# Patient Record
Sex: Female | Born: 1965
Health system: Southern US, Community
[De-identification: ages and names within clinical notes are randomized; demographics above are authoritative.]

## PROBLEM LIST (undated history)

## (undated) DIAGNOSIS — G43909 Migraine, unspecified, not intractable, without status migrainosus: Secondary | ICD-10-CM

## (undated) DIAGNOSIS — C4491 Basal cell carcinoma of skin, unspecified: Secondary | ICD-10-CM

## (undated) DIAGNOSIS — K219 Gastro-esophageal reflux disease without esophagitis: Secondary | ICD-10-CM

## (undated) DIAGNOSIS — J45909 Unspecified asthma, uncomplicated: Secondary | ICD-10-CM

## (undated) DIAGNOSIS — E7800 Pure hypercholesterolemia, unspecified: Secondary | ICD-10-CM

## (undated) DIAGNOSIS — E119 Type 2 diabetes mellitus without complications: Secondary | ICD-10-CM

## (undated) DIAGNOSIS — N6452 Nipple discharge: Secondary | ICD-10-CM

## (undated) DIAGNOSIS — Z8489 Family history of other specified conditions: Secondary | ICD-10-CM

## (undated) DIAGNOSIS — S299XXA Unspecified injury of thorax, initial encounter: Secondary | ICD-10-CM

## (undated) DIAGNOSIS — F419 Anxiety disorder, unspecified: Secondary | ICD-10-CM

---

## 2000-05-26 ENCOUNTER — Other Ambulatory Visit: Admission: RE | Admit: 2000-05-26 | Discharge: 2000-05-26 | Payer: Self-pay | Admitting: Obstetrics and Gynecology

## 2001-07-13 ENCOUNTER — Other Ambulatory Visit: Admission: RE | Admit: 2001-07-13 | Discharge: 2001-07-13 | Payer: Self-pay | Admitting: Obstetrics and Gynecology

## 2002-07-17 ENCOUNTER — Other Ambulatory Visit: Admission: RE | Admit: 2002-07-17 | Discharge: 2002-07-17 | Payer: Self-pay | Admitting: Obstetrics and Gynecology

## 2003-04-14 ENCOUNTER — Emergency Department (HOSPITAL_COMMUNITY): Admission: EM | Admit: 2003-04-14 | Discharge: 2003-04-14 | Payer: Self-pay | Admitting: Emergency Medicine

## 2003-08-08 ENCOUNTER — Encounter: Admission: RE | Admit: 2003-08-08 | Discharge: 2003-11-06 | Payer: Self-pay | Admitting: Obstetrics and Gynecology

## 2004-03-26 ENCOUNTER — Encounter: Admission: RE | Admit: 2004-03-26 | Discharge: 2004-05-01 | Payer: Self-pay | Admitting: Obstetrics and Gynecology

## 2004-05-29 ENCOUNTER — Encounter: Admission: RE | Admit: 2004-05-29 | Discharge: 2004-08-27 | Payer: Self-pay | Admitting: Obstetrics and Gynecology

## 2004-10-06 ENCOUNTER — Encounter: Admission: RE | Admit: 2004-10-06 | Discharge: 2005-01-04 | Payer: Self-pay | Admitting: Obstetrics and Gynecology

## 2005-01-12 ENCOUNTER — Encounter: Admission: RE | Admit: 2005-01-12 | Discharge: 2005-01-12 | Payer: Self-pay | Admitting: Family Medicine

## 2008-12-24 ENCOUNTER — Other Ambulatory Visit: Admission: RE | Admit: 2008-12-24 | Discharge: 2008-12-24 | Payer: Self-pay | Admitting: Family Medicine

## 2010-12-24 ENCOUNTER — Other Ambulatory Visit: Payer: Self-pay | Admitting: Family Medicine

## 2010-12-24 ENCOUNTER — Other Ambulatory Visit (HOSPITAL_COMMUNITY)
Admission: RE | Admit: 2010-12-24 | Discharge: 2010-12-24 | Disposition: A | Discharge status: Home / Self Care | Payer: BC Managed Care – PPO | Source: Ambulatory Visit | Attending: Family Medicine | Admitting: Family Medicine

## 2010-12-24 DIAGNOSIS — Z124 Encounter for screening for malignant neoplasm of cervix: Secondary | ICD-10-CM | POA: Insufficient documentation

## 2010-12-24 DIAGNOSIS — Z1159 Encounter for screening for other viral diseases: Secondary | ICD-10-CM | POA: Insufficient documentation

## 2014-01-19 ENCOUNTER — Other Ambulatory Visit (HOSPITAL_COMMUNITY)
Admission: RE | Admit: 2014-01-19 | Discharge: 2014-01-19 | Disposition: A | Discharge status: Home / Self Care | Payer: BC Managed Care – PPO | Source: Ambulatory Visit | Attending: Family Medicine | Admitting: Family Medicine

## 2014-01-19 ENCOUNTER — Other Ambulatory Visit: Payer: Self-pay | Admitting: Family Medicine

## 2014-01-19 DIAGNOSIS — Z124 Encounter for screening for malignant neoplasm of cervix: Secondary | ICD-10-CM | POA: Insufficient documentation

## 2014-01-22 LAB — CYTOLOGY - PAP

## 2015-08-29 DIAGNOSIS — F419 Anxiety disorder, unspecified: Secondary | ICD-10-CM | POA: Diagnosis not present

## 2015-08-29 DIAGNOSIS — E782 Mixed hyperlipidemia: Secondary | ICD-10-CM | POA: Diagnosis not present

## 2016-01-20 DIAGNOSIS — M9903 Segmental and somatic dysfunction of lumbar region: Secondary | ICD-10-CM | POA: Diagnosis not present

## 2016-01-20 DIAGNOSIS — M9901 Segmental and somatic dysfunction of cervical region: Secondary | ICD-10-CM | POA: Diagnosis not present

## 2016-01-20 DIAGNOSIS — M6080 Other myositis, unspecified site: Secondary | ICD-10-CM | POA: Diagnosis not present

## 2016-01-20 DIAGNOSIS — M542 Cervicalgia: Secondary | ICD-10-CM | POA: Diagnosis not present

## 2016-01-21 DIAGNOSIS — M9903 Segmental and somatic dysfunction of lumbar region: Secondary | ICD-10-CM | POA: Diagnosis not present

## 2016-01-21 DIAGNOSIS — M6080 Other myositis, unspecified site: Secondary | ICD-10-CM | POA: Diagnosis not present

## 2016-01-21 DIAGNOSIS — M542 Cervicalgia: Secondary | ICD-10-CM | POA: Diagnosis not present

## 2016-01-21 DIAGNOSIS — M9901 Segmental and somatic dysfunction of cervical region: Secondary | ICD-10-CM | POA: Diagnosis not present

## 2016-01-22 DIAGNOSIS — M542 Cervicalgia: Secondary | ICD-10-CM | POA: Diagnosis not present

## 2016-01-22 DIAGNOSIS — M9901 Segmental and somatic dysfunction of cervical region: Secondary | ICD-10-CM | POA: Diagnosis not present

## 2016-01-22 DIAGNOSIS — M6080 Other myositis, unspecified site: Secondary | ICD-10-CM | POA: Diagnosis not present

## 2016-01-22 DIAGNOSIS — M9903 Segmental and somatic dysfunction of lumbar region: Secondary | ICD-10-CM | POA: Diagnosis not present

## 2016-01-23 DIAGNOSIS — M6080 Other myositis, unspecified site: Secondary | ICD-10-CM | POA: Diagnosis not present

## 2016-01-23 DIAGNOSIS — M542 Cervicalgia: Secondary | ICD-10-CM | POA: Diagnosis not present

## 2016-01-23 DIAGNOSIS — M9901 Segmental and somatic dysfunction of cervical region: Secondary | ICD-10-CM | POA: Diagnosis not present

## 2016-01-23 DIAGNOSIS — M9903 Segmental and somatic dysfunction of lumbar region: Secondary | ICD-10-CM | POA: Diagnosis not present

## 2016-01-27 DIAGNOSIS — M542 Cervicalgia: Secondary | ICD-10-CM | POA: Diagnosis not present

## 2016-01-27 DIAGNOSIS — M9901 Segmental and somatic dysfunction of cervical region: Secondary | ICD-10-CM | POA: Diagnosis not present

## 2016-01-27 DIAGNOSIS — M6080 Other myositis, unspecified site: Secondary | ICD-10-CM | POA: Diagnosis not present

## 2016-01-27 DIAGNOSIS — M9903 Segmental and somatic dysfunction of lumbar region: Secondary | ICD-10-CM | POA: Diagnosis not present

## 2016-01-29 DIAGNOSIS — M6080 Other myositis, unspecified site: Secondary | ICD-10-CM | POA: Diagnosis not present

## 2016-01-29 DIAGNOSIS — M9901 Segmental and somatic dysfunction of cervical region: Secondary | ICD-10-CM | POA: Diagnosis not present

## 2016-01-29 DIAGNOSIS — M542 Cervicalgia: Secondary | ICD-10-CM | POA: Diagnosis not present

## 2016-01-29 DIAGNOSIS — M9903 Segmental and somatic dysfunction of lumbar region: Secondary | ICD-10-CM | POA: Diagnosis not present

## 2016-01-30 DIAGNOSIS — M9903 Segmental and somatic dysfunction of lumbar region: Secondary | ICD-10-CM | POA: Diagnosis not present

## 2016-01-30 DIAGNOSIS — M6080 Other myositis, unspecified site: Secondary | ICD-10-CM | POA: Diagnosis not present

## 2016-01-30 DIAGNOSIS — M9901 Segmental and somatic dysfunction of cervical region: Secondary | ICD-10-CM | POA: Diagnosis not present

## 2016-01-30 DIAGNOSIS — M542 Cervicalgia: Secondary | ICD-10-CM | POA: Diagnosis not present

## 2016-02-04 DIAGNOSIS — M9903 Segmental and somatic dysfunction of lumbar region: Secondary | ICD-10-CM | POA: Diagnosis not present

## 2016-02-04 DIAGNOSIS — M542 Cervicalgia: Secondary | ICD-10-CM | POA: Diagnosis not present

## 2016-02-04 DIAGNOSIS — M9901 Segmental and somatic dysfunction of cervical region: Secondary | ICD-10-CM | POA: Diagnosis not present

## 2016-02-04 DIAGNOSIS — M6080 Other myositis, unspecified site: Secondary | ICD-10-CM | POA: Diagnosis not present

## 2016-02-06 DIAGNOSIS — M6080 Other myositis, unspecified site: Secondary | ICD-10-CM | POA: Diagnosis not present

## 2016-02-06 DIAGNOSIS — M9901 Segmental and somatic dysfunction of cervical region: Secondary | ICD-10-CM | POA: Diagnosis not present

## 2016-02-06 DIAGNOSIS — M9903 Segmental and somatic dysfunction of lumbar region: Secondary | ICD-10-CM | POA: Diagnosis not present

## 2016-02-06 DIAGNOSIS — M542 Cervicalgia: Secondary | ICD-10-CM | POA: Diagnosis not present

## 2016-02-10 DIAGNOSIS — M6080 Other myositis, unspecified site: Secondary | ICD-10-CM | POA: Diagnosis not present

## 2016-02-10 DIAGNOSIS — M9903 Segmental and somatic dysfunction of lumbar region: Secondary | ICD-10-CM | POA: Diagnosis not present

## 2016-02-10 DIAGNOSIS — M542 Cervicalgia: Secondary | ICD-10-CM | POA: Diagnosis not present

## 2016-02-10 DIAGNOSIS — M9901 Segmental and somatic dysfunction of cervical region: Secondary | ICD-10-CM | POA: Diagnosis not present

## 2016-02-12 DIAGNOSIS — M9903 Segmental and somatic dysfunction of lumbar region: Secondary | ICD-10-CM | POA: Diagnosis not present

## 2016-02-12 DIAGNOSIS — M542 Cervicalgia: Secondary | ICD-10-CM | POA: Diagnosis not present

## 2016-02-12 DIAGNOSIS — M9901 Segmental and somatic dysfunction of cervical region: Secondary | ICD-10-CM | POA: Diagnosis not present

## 2016-02-12 DIAGNOSIS — M6080 Other myositis, unspecified site: Secondary | ICD-10-CM | POA: Diagnosis not present

## 2016-02-19 DIAGNOSIS — M542 Cervicalgia: Secondary | ICD-10-CM | POA: Diagnosis not present

## 2016-02-19 DIAGNOSIS — M6080 Other myositis, unspecified site: Secondary | ICD-10-CM | POA: Diagnosis not present

## 2016-02-19 DIAGNOSIS — M9901 Segmental and somatic dysfunction of cervical region: Secondary | ICD-10-CM | POA: Diagnosis not present

## 2016-02-19 DIAGNOSIS — M9903 Segmental and somatic dysfunction of lumbar region: Secondary | ICD-10-CM | POA: Diagnosis not present

## 2016-02-26 DIAGNOSIS — M9903 Segmental and somatic dysfunction of lumbar region: Secondary | ICD-10-CM | POA: Diagnosis not present

## 2016-02-26 DIAGNOSIS — M6080 Other myositis, unspecified site: Secondary | ICD-10-CM | POA: Diagnosis not present

## 2016-02-26 DIAGNOSIS — M542 Cervicalgia: Secondary | ICD-10-CM | POA: Diagnosis not present

## 2016-02-26 DIAGNOSIS — M9901 Segmental and somatic dysfunction of cervical region: Secondary | ICD-10-CM | POA: Diagnosis not present

## 2016-03-16 DIAGNOSIS — Z79899 Other long term (current) drug therapy: Secondary | ICD-10-CM | POA: Diagnosis not present

## 2016-03-16 DIAGNOSIS — Z0000 Encounter for general adult medical examination without abnormal findings: Secondary | ICD-10-CM | POA: Diagnosis not present

## 2016-03-16 DIAGNOSIS — E782 Mixed hyperlipidemia: Secondary | ICD-10-CM | POA: Diagnosis not present

## 2016-03-16 DIAGNOSIS — E1122 Type 2 diabetes mellitus with diabetic chronic kidney disease: Secondary | ICD-10-CM | POA: Diagnosis not present

## 2016-03-17 DIAGNOSIS — G43109 Migraine with aura, not intractable, without status migrainosus: Secondary | ICD-10-CM | POA: Diagnosis not present

## 2016-05-21 DIAGNOSIS — M9901 Segmental and somatic dysfunction of cervical region: Secondary | ICD-10-CM | POA: Diagnosis not present

## 2016-05-21 DIAGNOSIS — M542 Cervicalgia: Secondary | ICD-10-CM | POA: Diagnosis not present

## 2016-05-21 DIAGNOSIS — M9903 Segmental and somatic dysfunction of lumbar region: Secondary | ICD-10-CM | POA: Diagnosis not present

## 2016-05-21 DIAGNOSIS — M6080 Other myositis, unspecified site: Secondary | ICD-10-CM | POA: Diagnosis not present

## 2016-05-25 DIAGNOSIS — M542 Cervicalgia: Secondary | ICD-10-CM | POA: Diagnosis not present

## 2016-05-25 DIAGNOSIS — M9901 Segmental and somatic dysfunction of cervical region: Secondary | ICD-10-CM | POA: Diagnosis not present

## 2016-05-25 DIAGNOSIS — M6080 Other myositis, unspecified site: Secondary | ICD-10-CM | POA: Diagnosis not present

## 2016-05-25 DIAGNOSIS — M9903 Segmental and somatic dysfunction of lumbar region: Secondary | ICD-10-CM | POA: Diagnosis not present

## 2016-05-27 DIAGNOSIS — M542 Cervicalgia: Secondary | ICD-10-CM | POA: Diagnosis not present

## 2016-05-27 DIAGNOSIS — M6080 Other myositis, unspecified site: Secondary | ICD-10-CM | POA: Diagnosis not present

## 2016-05-27 DIAGNOSIS — M9903 Segmental and somatic dysfunction of lumbar region: Secondary | ICD-10-CM | POA: Diagnosis not present

## 2016-05-27 DIAGNOSIS — M9901 Segmental and somatic dysfunction of cervical region: Secondary | ICD-10-CM | POA: Diagnosis not present

## 2016-06-04 DIAGNOSIS — M6080 Other myositis, unspecified site: Secondary | ICD-10-CM | POA: Diagnosis not present

## 2016-06-04 DIAGNOSIS — M9903 Segmental and somatic dysfunction of lumbar region: Secondary | ICD-10-CM | POA: Diagnosis not present

## 2016-06-04 DIAGNOSIS — M542 Cervicalgia: Secondary | ICD-10-CM | POA: Diagnosis not present

## 2016-06-04 DIAGNOSIS — M9901 Segmental and somatic dysfunction of cervical region: Secondary | ICD-10-CM | POA: Diagnosis not present

## 2016-06-11 DIAGNOSIS — M9903 Segmental and somatic dysfunction of lumbar region: Secondary | ICD-10-CM | POA: Diagnosis not present

## 2016-06-11 DIAGNOSIS — M6080 Other myositis, unspecified site: Secondary | ICD-10-CM | POA: Diagnosis not present

## 2016-06-11 DIAGNOSIS — M9901 Segmental and somatic dysfunction of cervical region: Secondary | ICD-10-CM | POA: Diagnosis not present

## 2016-06-11 DIAGNOSIS — M542 Cervicalgia: Secondary | ICD-10-CM | POA: Diagnosis not present

## 2016-06-11 DIAGNOSIS — E2839 Other primary ovarian failure: Secondary | ICD-10-CM | POA: Diagnosis not present

## 2016-06-17 DIAGNOSIS — M9901 Segmental and somatic dysfunction of cervical region: Secondary | ICD-10-CM | POA: Diagnosis not present

## 2016-06-17 DIAGNOSIS — M6080 Other myositis, unspecified site: Secondary | ICD-10-CM | POA: Diagnosis not present

## 2016-06-17 DIAGNOSIS — M9903 Segmental and somatic dysfunction of lumbar region: Secondary | ICD-10-CM | POA: Diagnosis not present

## 2016-06-17 DIAGNOSIS — M542 Cervicalgia: Secondary | ICD-10-CM | POA: Diagnosis not present

## 2016-06-18 DIAGNOSIS — M9903 Segmental and somatic dysfunction of lumbar region: Secondary | ICD-10-CM | POA: Diagnosis not present

## 2016-06-18 DIAGNOSIS — M6080 Other myositis, unspecified site: Secondary | ICD-10-CM | POA: Diagnosis not present

## 2016-06-18 DIAGNOSIS — M542 Cervicalgia: Secondary | ICD-10-CM | POA: Diagnosis not present

## 2016-06-18 DIAGNOSIS — M9901 Segmental and somatic dysfunction of cervical region: Secondary | ICD-10-CM | POA: Diagnosis not present

## 2016-06-23 DIAGNOSIS — M9903 Segmental and somatic dysfunction of lumbar region: Secondary | ICD-10-CM | POA: Diagnosis not present

## 2016-06-23 DIAGNOSIS — M6080 Other myositis, unspecified site: Secondary | ICD-10-CM | POA: Diagnosis not present

## 2016-06-23 DIAGNOSIS — M9901 Segmental and somatic dysfunction of cervical region: Secondary | ICD-10-CM | POA: Diagnosis not present

## 2016-06-23 DIAGNOSIS — M542 Cervicalgia: Secondary | ICD-10-CM | POA: Diagnosis not present

## 2016-06-24 DIAGNOSIS — M542 Cervicalgia: Secondary | ICD-10-CM | POA: Diagnosis not present

## 2016-06-24 DIAGNOSIS — M9901 Segmental and somatic dysfunction of cervical region: Secondary | ICD-10-CM | POA: Diagnosis not present

## 2016-06-24 DIAGNOSIS — M9903 Segmental and somatic dysfunction of lumbar region: Secondary | ICD-10-CM | POA: Diagnosis not present

## 2016-06-24 DIAGNOSIS — M6080 Other myositis, unspecified site: Secondary | ICD-10-CM | POA: Diagnosis not present

## 2016-06-25 DIAGNOSIS — M542 Cervicalgia: Secondary | ICD-10-CM | POA: Diagnosis not present

## 2016-06-25 DIAGNOSIS — M6080 Other myositis, unspecified site: Secondary | ICD-10-CM | POA: Diagnosis not present

## 2016-06-25 DIAGNOSIS — S299XXA Unspecified injury of thorax, initial encounter: Secondary | ICD-10-CM

## 2016-06-25 DIAGNOSIS — N63 Unspecified lump in unspecified breast: Secondary | ICD-10-CM | POA: Diagnosis not present

## 2016-06-25 DIAGNOSIS — M9903 Segmental and somatic dysfunction of lumbar region: Secondary | ICD-10-CM | POA: Diagnosis not present

## 2016-06-25 DIAGNOSIS — M9901 Segmental and somatic dysfunction of cervical region: Secondary | ICD-10-CM | POA: Diagnosis not present

## 2016-06-25 DIAGNOSIS — Z1211 Encounter for screening for malignant neoplasm of colon: Secondary | ICD-10-CM | POA: Diagnosis not present

## 2016-06-25 HISTORY — DX: Unspecified injury of thorax, initial encounter: S29.9XXA

## 2016-06-26 ENCOUNTER — Other Ambulatory Visit: Payer: Self-pay | Admitting: Family Medicine

## 2016-06-29 ENCOUNTER — Other Ambulatory Visit: Payer: Self-pay | Admitting: Family Medicine

## 2016-06-29 DIAGNOSIS — M9903 Segmental and somatic dysfunction of lumbar region: Secondary | ICD-10-CM | POA: Diagnosis not present

## 2016-06-29 DIAGNOSIS — M542 Cervicalgia: Secondary | ICD-10-CM | POA: Diagnosis not present

## 2016-06-29 DIAGNOSIS — M6080 Other myositis, unspecified site: Secondary | ICD-10-CM | POA: Diagnosis not present

## 2016-06-29 DIAGNOSIS — N63 Unspecified lump in unspecified breast: Secondary | ICD-10-CM

## 2016-06-29 DIAGNOSIS — M9901 Segmental and somatic dysfunction of cervical region: Secondary | ICD-10-CM | POA: Diagnosis not present

## 2016-06-30 ENCOUNTER — Other Ambulatory Visit: Payer: Self-pay | Admitting: Family Medicine

## 2016-06-30 DIAGNOSIS — N63 Unspecified lump in unspecified breast: Secondary | ICD-10-CM

## 2016-07-02 DIAGNOSIS — M9901 Segmental and somatic dysfunction of cervical region: Secondary | ICD-10-CM | POA: Diagnosis not present

## 2016-07-02 DIAGNOSIS — M6080 Other myositis, unspecified site: Secondary | ICD-10-CM | POA: Diagnosis not present

## 2016-07-02 DIAGNOSIS — M542 Cervicalgia: Secondary | ICD-10-CM | POA: Diagnosis not present

## 2016-07-02 DIAGNOSIS — M9903 Segmental and somatic dysfunction of lumbar region: Secondary | ICD-10-CM | POA: Diagnosis not present

## 2016-07-09 DIAGNOSIS — M9901 Segmental and somatic dysfunction of cervical region: Secondary | ICD-10-CM | POA: Diagnosis not present

## 2016-07-09 DIAGNOSIS — M542 Cervicalgia: Secondary | ICD-10-CM | POA: Diagnosis not present

## 2016-07-09 DIAGNOSIS — M6080 Other myositis, unspecified site: Secondary | ICD-10-CM | POA: Diagnosis not present

## 2016-07-09 DIAGNOSIS — M9903 Segmental and somatic dysfunction of lumbar region: Secondary | ICD-10-CM | POA: Diagnosis not present

## 2016-07-15 DIAGNOSIS — M542 Cervicalgia: Secondary | ICD-10-CM | POA: Diagnosis not present

## 2016-07-15 DIAGNOSIS — M9901 Segmental and somatic dysfunction of cervical region: Secondary | ICD-10-CM | POA: Diagnosis not present

## 2016-07-15 DIAGNOSIS — M9903 Segmental and somatic dysfunction of lumbar region: Secondary | ICD-10-CM | POA: Diagnosis not present

## 2016-07-15 DIAGNOSIS — M6080 Other myositis, unspecified site: Secondary | ICD-10-CM | POA: Diagnosis not present

## 2016-07-22 DIAGNOSIS — M9903 Segmental and somatic dysfunction of lumbar region: Secondary | ICD-10-CM | POA: Diagnosis not present

## 2016-07-22 DIAGNOSIS — M6080 Other myositis, unspecified site: Secondary | ICD-10-CM | POA: Diagnosis not present

## 2016-07-22 DIAGNOSIS — M9901 Segmental and somatic dysfunction of cervical region: Secondary | ICD-10-CM | POA: Diagnosis not present

## 2016-07-22 DIAGNOSIS — M542 Cervicalgia: Secondary | ICD-10-CM | POA: Diagnosis not present

## 2016-07-27 ENCOUNTER — Ambulatory Visit
Admission: RE | Admit: 2016-07-27 | Discharge: 2016-07-27 | Disposition: A | Discharge status: Home / Self Care | Payer: BLUE CROSS/BLUE SHIELD | Source: Ambulatory Visit | Attending: Family Medicine | Admitting: Family Medicine

## 2016-07-27 DIAGNOSIS — N63 Unspecified lump in unspecified breast: Secondary | ICD-10-CM

## 2016-07-27 DIAGNOSIS — R928 Other abnormal and inconclusive findings on diagnostic imaging of breast: Secondary | ICD-10-CM | POA: Diagnosis not present

## 2016-07-27 HISTORY — DX: Unspecified injury of thorax, initial encounter: S29.9XXA

## 2016-07-29 DIAGNOSIS — M542 Cervicalgia: Secondary | ICD-10-CM | POA: Diagnosis not present

## 2016-07-29 DIAGNOSIS — M9903 Segmental and somatic dysfunction of lumbar region: Secondary | ICD-10-CM | POA: Diagnosis not present

## 2016-07-29 DIAGNOSIS — M6080 Other myositis, unspecified site: Secondary | ICD-10-CM | POA: Diagnosis not present

## 2016-07-29 DIAGNOSIS — M9901 Segmental and somatic dysfunction of cervical region: Secondary | ICD-10-CM | POA: Diagnosis not present

## 2016-08-03 DIAGNOSIS — M6080 Other myositis, unspecified site: Secondary | ICD-10-CM | POA: Diagnosis not present

## 2016-08-03 DIAGNOSIS — M542 Cervicalgia: Secondary | ICD-10-CM | POA: Diagnosis not present

## 2016-08-03 DIAGNOSIS — M9901 Segmental and somatic dysfunction of cervical region: Secondary | ICD-10-CM | POA: Diagnosis not present

## 2016-08-03 DIAGNOSIS — M9903 Segmental and somatic dysfunction of lumbar region: Secondary | ICD-10-CM | POA: Diagnosis not present

## 2016-08-05 DIAGNOSIS — M542 Cervicalgia: Secondary | ICD-10-CM | POA: Diagnosis not present

## 2016-08-05 DIAGNOSIS — M6080 Other myositis, unspecified site: Secondary | ICD-10-CM | POA: Diagnosis not present

## 2016-08-05 DIAGNOSIS — M9901 Segmental and somatic dysfunction of cervical region: Secondary | ICD-10-CM | POA: Diagnosis not present

## 2016-08-05 DIAGNOSIS — M9903 Segmental and somatic dysfunction of lumbar region: Secondary | ICD-10-CM | POA: Diagnosis not present

## 2016-08-10 DIAGNOSIS — M9903 Segmental and somatic dysfunction of lumbar region: Secondary | ICD-10-CM | POA: Diagnosis not present

## 2016-08-10 DIAGNOSIS — M6080 Other myositis, unspecified site: Secondary | ICD-10-CM | POA: Diagnosis not present

## 2016-08-10 DIAGNOSIS — M9901 Segmental and somatic dysfunction of cervical region: Secondary | ICD-10-CM | POA: Diagnosis not present

## 2016-08-10 DIAGNOSIS — M542 Cervicalgia: Secondary | ICD-10-CM | POA: Diagnosis not present

## 2016-08-12 DIAGNOSIS — M542 Cervicalgia: Secondary | ICD-10-CM | POA: Diagnosis not present

## 2016-08-12 DIAGNOSIS — M6080 Other myositis, unspecified site: Secondary | ICD-10-CM | POA: Diagnosis not present

## 2016-08-12 DIAGNOSIS — M9901 Segmental and somatic dysfunction of cervical region: Secondary | ICD-10-CM | POA: Diagnosis not present

## 2016-08-12 DIAGNOSIS — M9903 Segmental and somatic dysfunction of lumbar region: Secondary | ICD-10-CM | POA: Diagnosis not present

## 2016-08-17 DIAGNOSIS — M9903 Segmental and somatic dysfunction of lumbar region: Secondary | ICD-10-CM | POA: Diagnosis not present

## 2016-08-17 DIAGNOSIS — M542 Cervicalgia: Secondary | ICD-10-CM | POA: Diagnosis not present

## 2016-08-17 DIAGNOSIS — M9901 Segmental and somatic dysfunction of cervical region: Secondary | ICD-10-CM | POA: Diagnosis not present

## 2016-08-17 DIAGNOSIS — M6080 Other myositis, unspecified site: Secondary | ICD-10-CM | POA: Diagnosis not present

## 2016-08-20 DIAGNOSIS — M9901 Segmental and somatic dysfunction of cervical region: Secondary | ICD-10-CM | POA: Diagnosis not present

## 2016-08-20 DIAGNOSIS — M9903 Segmental and somatic dysfunction of lumbar region: Secondary | ICD-10-CM | POA: Diagnosis not present

## 2016-08-20 DIAGNOSIS — M6080 Other myositis, unspecified site: Secondary | ICD-10-CM | POA: Diagnosis not present

## 2016-08-20 DIAGNOSIS — M542 Cervicalgia: Secondary | ICD-10-CM | POA: Diagnosis not present

## 2016-08-24 DIAGNOSIS — M6080 Other myositis, unspecified site: Secondary | ICD-10-CM | POA: Diagnosis not present

## 2016-08-24 DIAGNOSIS — M542 Cervicalgia: Secondary | ICD-10-CM | POA: Diagnosis not present

## 2016-08-24 DIAGNOSIS — M9901 Segmental and somatic dysfunction of cervical region: Secondary | ICD-10-CM | POA: Diagnosis not present

## 2016-08-24 DIAGNOSIS — M9903 Segmental and somatic dysfunction of lumbar region: Secondary | ICD-10-CM | POA: Diagnosis not present

## 2016-08-26 DIAGNOSIS — M9903 Segmental and somatic dysfunction of lumbar region: Secondary | ICD-10-CM | POA: Diagnosis not present

## 2016-08-26 DIAGNOSIS — M6080 Other myositis, unspecified site: Secondary | ICD-10-CM | POA: Diagnosis not present

## 2016-08-26 DIAGNOSIS — M9901 Segmental and somatic dysfunction of cervical region: Secondary | ICD-10-CM | POA: Diagnosis not present

## 2016-08-26 DIAGNOSIS — M542 Cervicalgia: Secondary | ICD-10-CM | POA: Diagnosis not present

## 2016-09-02 DIAGNOSIS — M9903 Segmental and somatic dysfunction of lumbar region: Secondary | ICD-10-CM | POA: Diagnosis not present

## 2016-09-02 DIAGNOSIS — M542 Cervicalgia: Secondary | ICD-10-CM | POA: Diagnosis not present

## 2016-09-02 DIAGNOSIS — M9901 Segmental and somatic dysfunction of cervical region: Secondary | ICD-10-CM | POA: Diagnosis not present

## 2016-09-02 DIAGNOSIS — M6080 Other myositis, unspecified site: Secondary | ICD-10-CM | POA: Diagnosis not present

## 2016-09-09 DIAGNOSIS — M7502 Adhesive capsulitis of left shoulder: Secondary | ICD-10-CM | POA: Diagnosis not present

## 2016-09-10 DIAGNOSIS — M9903 Segmental and somatic dysfunction of lumbar region: Secondary | ICD-10-CM | POA: Diagnosis not present

## 2016-09-10 DIAGNOSIS — M542 Cervicalgia: Secondary | ICD-10-CM | POA: Diagnosis not present

## 2016-09-10 DIAGNOSIS — M6080 Other myositis, unspecified site: Secondary | ICD-10-CM | POA: Diagnosis not present

## 2016-09-10 DIAGNOSIS — M9901 Segmental and somatic dysfunction of cervical region: Secondary | ICD-10-CM | POA: Diagnosis not present

## 2016-09-16 DIAGNOSIS — M9901 Segmental and somatic dysfunction of cervical region: Secondary | ICD-10-CM | POA: Diagnosis not present

## 2016-09-16 DIAGNOSIS — M6080 Other myositis, unspecified site: Secondary | ICD-10-CM | POA: Diagnosis not present

## 2016-09-16 DIAGNOSIS — M542 Cervicalgia: Secondary | ICD-10-CM | POA: Diagnosis not present

## 2016-09-16 DIAGNOSIS — M9903 Segmental and somatic dysfunction of lumbar region: Secondary | ICD-10-CM | POA: Diagnosis not present

## 2016-09-24 DIAGNOSIS — M9903 Segmental and somatic dysfunction of lumbar region: Secondary | ICD-10-CM | POA: Diagnosis not present

## 2016-09-24 DIAGNOSIS — M6080 Other myositis, unspecified site: Secondary | ICD-10-CM | POA: Diagnosis not present

## 2016-09-24 DIAGNOSIS — M9901 Segmental and somatic dysfunction of cervical region: Secondary | ICD-10-CM | POA: Diagnosis not present

## 2016-09-24 DIAGNOSIS — M542 Cervicalgia: Secondary | ICD-10-CM | POA: Diagnosis not present

## 2016-09-30 DIAGNOSIS — M7502 Adhesive capsulitis of left shoulder: Secondary | ICD-10-CM | POA: Diagnosis not present

## 2016-10-07 DIAGNOSIS — M25512 Pain in left shoulder: Secondary | ICD-10-CM | POA: Diagnosis not present

## 2016-10-08 DIAGNOSIS — M9903 Segmental and somatic dysfunction of lumbar region: Secondary | ICD-10-CM | POA: Diagnosis not present

## 2016-10-08 DIAGNOSIS — M6080 Other myositis, unspecified site: Secondary | ICD-10-CM | POA: Diagnosis not present

## 2016-10-08 DIAGNOSIS — M542 Cervicalgia: Secondary | ICD-10-CM | POA: Diagnosis not present

## 2016-10-08 DIAGNOSIS — M9901 Segmental and somatic dysfunction of cervical region: Secondary | ICD-10-CM | POA: Diagnosis not present

## 2016-10-20 DIAGNOSIS — M25512 Pain in left shoulder: Secondary | ICD-10-CM | POA: Diagnosis not present

## 2016-10-27 DIAGNOSIS — M25612 Stiffness of left shoulder, not elsewhere classified: Secondary | ICD-10-CM | POA: Diagnosis not present

## 2016-10-27 DIAGNOSIS — M7502 Adhesive capsulitis of left shoulder: Secondary | ICD-10-CM | POA: Diagnosis not present

## 2016-10-27 DIAGNOSIS — M25512 Pain in left shoulder: Secondary | ICD-10-CM | POA: Diagnosis not present

## 2016-11-04 DIAGNOSIS — M7502 Adhesive capsulitis of left shoulder: Secondary | ICD-10-CM | POA: Diagnosis not present

## 2016-11-04 DIAGNOSIS — M25612 Stiffness of left shoulder, not elsewhere classified: Secondary | ICD-10-CM | POA: Diagnosis not present

## 2016-11-04 DIAGNOSIS — M25512 Pain in left shoulder: Secondary | ICD-10-CM | POA: Diagnosis not present

## 2016-11-06 DIAGNOSIS — F419 Anxiety disorder, unspecified: Secondary | ICD-10-CM | POA: Diagnosis not present

## 2016-11-06 DIAGNOSIS — E1122 Type 2 diabetes mellitus with diabetic chronic kidney disease: Secondary | ICD-10-CM | POA: Diagnosis not present

## 2016-11-06 DIAGNOSIS — L732 Hidradenitis suppurativa: Secondary | ICD-10-CM | POA: Diagnosis not present

## 2016-11-06 DIAGNOSIS — E782 Mixed hyperlipidemia: Secondary | ICD-10-CM | POA: Diagnosis not present

## 2016-11-20 DIAGNOSIS — L0292 Furuncle, unspecified: Secondary | ICD-10-CM | POA: Diagnosis not present

## 2017-02-18 DIAGNOSIS — L089 Local infection of the skin and subcutaneous tissue, unspecified: Secondary | ICD-10-CM | POA: Diagnosis not present

## 2017-03-26 ENCOUNTER — Other Ambulatory Visit: Payer: Self-pay | Admitting: Family Medicine

## 2017-03-26 ENCOUNTER — Other Ambulatory Visit (HOSPITAL_COMMUNITY)
Admission: RE | Admit: 2017-03-26 | Discharge: 2017-03-26 | Disposition: A | Discharge status: Home / Self Care | Payer: BLUE CROSS/BLUE SHIELD | Source: Ambulatory Visit | Attending: Family Medicine | Admitting: Family Medicine

## 2017-03-26 DIAGNOSIS — E559 Vitamin D deficiency, unspecified: Secondary | ICD-10-CM | POA: Diagnosis not present

## 2017-03-26 DIAGNOSIS — Z0000 Encounter for general adult medical examination without abnormal findings: Secondary | ICD-10-CM | POA: Diagnosis not present

## 2017-03-26 DIAGNOSIS — Z79899 Other long term (current) drug therapy: Secondary | ICD-10-CM | POA: Diagnosis not present

## 2017-03-26 DIAGNOSIS — Z124 Encounter for screening for malignant neoplasm of cervix: Secondary | ICD-10-CM | POA: Diagnosis not present

## 2017-03-26 DIAGNOSIS — Z01411 Encounter for gynecological examination (general) (routine) with abnormal findings: Secondary | ICD-10-CM | POA: Diagnosis not present

## 2017-03-26 DIAGNOSIS — E1122 Type 2 diabetes mellitus with diabetic chronic kidney disease: Secondary | ICD-10-CM | POA: Diagnosis not present

## 2017-03-26 DIAGNOSIS — E782 Mixed hyperlipidemia: Secondary | ICD-10-CM | POA: Diagnosis not present

## 2017-03-30 LAB — CYTOLOGY - PAP: HPV: DETECTED — AB

## 2017-04-28 ENCOUNTER — Other Ambulatory Visit: Payer: Self-pay | Admitting: Obstetrics and Gynecology

## 2017-04-28 DIAGNOSIS — N63 Unspecified lump in unspecified breast: Secondary | ICD-10-CM | POA: Diagnosis not present

## 2017-04-28 DIAGNOSIS — D260 Other benign neoplasm of cervix uteri: Secondary | ICD-10-CM | POA: Diagnosis not present

## 2017-04-28 DIAGNOSIS — N72 Inflammatory disease of cervix uteri: Secondary | ICD-10-CM | POA: Diagnosis not present

## 2017-04-28 DIAGNOSIS — R8789 Other abnormal findings in specimens from female genital organs: Secondary | ICD-10-CM | POA: Diagnosis not present

## 2017-04-29 ENCOUNTER — Other Ambulatory Visit: Payer: Self-pay | Admitting: Obstetrics and Gynecology

## 2017-04-29 DIAGNOSIS — N6320 Unspecified lump in the left breast, unspecified quadrant: Secondary | ICD-10-CM

## 2017-05-04 ENCOUNTER — Other Ambulatory Visit: Payer: Self-pay

## 2017-05-04 ENCOUNTER — Other Ambulatory Visit: Payer: Self-pay | Admitting: Obstetrics and Gynecology

## 2017-05-04 DIAGNOSIS — N6320 Unspecified lump in the left breast, unspecified quadrant: Secondary | ICD-10-CM

## 2017-05-05 ENCOUNTER — Ambulatory Visit
Admission: RE | Admit: 2017-05-05 | Discharge: 2017-05-05 | Disposition: A | Discharge status: Home / Self Care | Payer: BLUE CROSS/BLUE SHIELD | Source: Ambulatory Visit | Attending: Obstetrics and Gynecology | Admitting: Obstetrics and Gynecology

## 2017-05-05 ENCOUNTER — Other Ambulatory Visit: Payer: Self-pay | Admitting: Obstetrics and Gynecology

## 2017-05-05 DIAGNOSIS — N6320 Unspecified lump in the left breast, unspecified quadrant: Secondary | ICD-10-CM

## 2017-05-05 DIAGNOSIS — N611 Abscess of the breast and nipple: Secondary | ICD-10-CM

## 2017-05-05 DIAGNOSIS — R928 Other abnormal and inconclusive findings on diagnostic imaging of breast: Secondary | ICD-10-CM | POA: Diagnosis not present

## 2017-05-05 HISTORY — DX: Nipple discharge: N64.52

## 2017-05-31 DIAGNOSIS — M9903 Segmental and somatic dysfunction of lumbar region: Secondary | ICD-10-CM | POA: Diagnosis not present

## 2017-05-31 DIAGNOSIS — M542 Cervicalgia: Secondary | ICD-10-CM | POA: Diagnosis not present

## 2017-05-31 DIAGNOSIS — M9901 Segmental and somatic dysfunction of cervical region: Secondary | ICD-10-CM | POA: Diagnosis not present

## 2017-05-31 DIAGNOSIS — M6080 Other myositis, unspecified site: Secondary | ICD-10-CM | POA: Diagnosis not present

## 2017-06-02 DIAGNOSIS — M6080 Other myositis, unspecified site: Secondary | ICD-10-CM | POA: Diagnosis not present

## 2017-06-02 DIAGNOSIS — M9903 Segmental and somatic dysfunction of lumbar region: Secondary | ICD-10-CM | POA: Diagnosis not present

## 2017-06-02 DIAGNOSIS — M542 Cervicalgia: Secondary | ICD-10-CM | POA: Diagnosis not present

## 2017-06-02 DIAGNOSIS — M9901 Segmental and somatic dysfunction of cervical region: Secondary | ICD-10-CM | POA: Diagnosis not present

## 2017-06-03 DIAGNOSIS — M6080 Other myositis, unspecified site: Secondary | ICD-10-CM | POA: Diagnosis not present

## 2017-06-03 DIAGNOSIS — M542 Cervicalgia: Secondary | ICD-10-CM | POA: Diagnosis not present

## 2017-06-03 DIAGNOSIS — M9901 Segmental and somatic dysfunction of cervical region: Secondary | ICD-10-CM | POA: Diagnosis not present

## 2017-06-03 DIAGNOSIS — M9903 Segmental and somatic dysfunction of lumbar region: Secondary | ICD-10-CM | POA: Diagnosis not present

## 2017-07-01 DIAGNOSIS — M9903 Segmental and somatic dysfunction of lumbar region: Secondary | ICD-10-CM | POA: Diagnosis not present

## 2017-07-01 DIAGNOSIS — M9901 Segmental and somatic dysfunction of cervical region: Secondary | ICD-10-CM | POA: Diagnosis not present

## 2017-07-01 DIAGNOSIS — M6080 Other myositis, unspecified site: Secondary | ICD-10-CM | POA: Diagnosis not present

## 2017-07-01 DIAGNOSIS — M542 Cervicalgia: Secondary | ICD-10-CM | POA: Diagnosis not present

## 2017-07-07 DIAGNOSIS — L590 Erythema ab igne [dermatitis ab igne]: Secondary | ICD-10-CM | POA: Diagnosis not present

## 2017-07-29 DIAGNOSIS — M6080 Other myositis, unspecified site: Secondary | ICD-10-CM | POA: Diagnosis not present

## 2017-07-29 DIAGNOSIS — M9903 Segmental and somatic dysfunction of lumbar region: Secondary | ICD-10-CM | POA: Diagnosis not present

## 2017-07-29 DIAGNOSIS — M9901 Segmental and somatic dysfunction of cervical region: Secondary | ICD-10-CM | POA: Diagnosis not present

## 2017-07-29 DIAGNOSIS — M542 Cervicalgia: Secondary | ICD-10-CM | POA: Diagnosis not present

## 2017-08-04 ENCOUNTER — Ambulatory Visit
Admission: RE | Admit: 2017-08-04 | Discharge: 2017-08-04 | Disposition: A | Discharge status: Home / Self Care | Payer: BLUE CROSS/BLUE SHIELD | Source: Ambulatory Visit | Attending: Obstetrics and Gynecology | Admitting: Obstetrics and Gynecology

## 2017-08-04 ENCOUNTER — Other Ambulatory Visit: Payer: Self-pay | Admitting: Obstetrics and Gynecology

## 2017-08-04 DIAGNOSIS — R928 Other abnormal and inconclusive findings on diagnostic imaging of breast: Secondary | ICD-10-CM | POA: Diagnosis not present

## 2017-08-04 DIAGNOSIS — N611 Abscess of the breast and nipple: Secondary | ICD-10-CM

## 2017-08-04 DIAGNOSIS — N6310 Unspecified lump in the right breast, unspecified quadrant: Secondary | ICD-10-CM

## 2017-08-04 DIAGNOSIS — N6322 Unspecified lump in the left breast, upper inner quadrant: Secondary | ICD-10-CM | POA: Diagnosis not present

## 2017-08-04 DIAGNOSIS — N6321 Unspecified lump in the left breast, upper outer quadrant: Secondary | ICD-10-CM | POA: Diagnosis not present

## 2017-08-04 DIAGNOSIS — N6311 Unspecified lump in the right breast, upper outer quadrant: Secondary | ICD-10-CM | POA: Diagnosis not present

## 2017-08-05 ENCOUNTER — Ambulatory Visit
Admission: RE | Admit: 2017-08-05 | Discharge: 2017-08-05 | Disposition: A | Discharge status: Home / Self Care | Payer: BLUE CROSS/BLUE SHIELD | Source: Ambulatory Visit | Attending: Obstetrics and Gynecology | Admitting: Obstetrics and Gynecology

## 2017-08-05 ENCOUNTER — Other Ambulatory Visit: Payer: Self-pay | Admitting: Obstetrics and Gynecology

## 2017-08-05 DIAGNOSIS — N6310 Unspecified lump in the right breast, unspecified quadrant: Secondary | ICD-10-CM

## 2017-08-05 DIAGNOSIS — N611 Abscess of the breast and nipple: Secondary | ICD-10-CM | POA: Diagnosis not present

## 2017-08-10 LAB — AEROBIC/ANAEROBIC CULTURE W GRAM STAIN (SURGICAL/DEEP WOUND)

## 2017-08-13 ENCOUNTER — Ambulatory Visit
Admission: RE | Admit: 2017-08-13 | Discharge: 2017-08-13 | Disposition: A | Discharge status: Home / Self Care | Payer: BLUE CROSS/BLUE SHIELD | Source: Ambulatory Visit | Attending: Obstetrics and Gynecology | Admitting: Obstetrics and Gynecology

## 2017-08-13 ENCOUNTER — Other Ambulatory Visit: Payer: Self-pay | Admitting: Obstetrics and Gynecology

## 2017-08-13 DIAGNOSIS — N611 Abscess of the breast and nipple: Secondary | ICD-10-CM | POA: Diagnosis not present

## 2017-08-13 DIAGNOSIS — N6310 Unspecified lump in the right breast, unspecified quadrant: Secondary | ICD-10-CM

## 2017-08-17 DIAGNOSIS — M9903 Segmental and somatic dysfunction of lumbar region: Secondary | ICD-10-CM | POA: Diagnosis not present

## 2017-08-17 DIAGNOSIS — M542 Cervicalgia: Secondary | ICD-10-CM | POA: Diagnosis not present

## 2017-08-17 DIAGNOSIS — M6080 Other myositis, unspecified site: Secondary | ICD-10-CM | POA: Diagnosis not present

## 2017-08-17 DIAGNOSIS — M9901 Segmental and somatic dysfunction of cervical region: Secondary | ICD-10-CM | POA: Diagnosis not present

## 2017-08-18 DIAGNOSIS — M542 Cervicalgia: Secondary | ICD-10-CM | POA: Diagnosis not present

## 2017-08-18 DIAGNOSIS — M9903 Segmental and somatic dysfunction of lumbar region: Secondary | ICD-10-CM | POA: Diagnosis not present

## 2017-08-18 DIAGNOSIS — M6080 Other myositis, unspecified site: Secondary | ICD-10-CM | POA: Diagnosis not present

## 2017-08-18 DIAGNOSIS — M9901 Segmental and somatic dysfunction of cervical region: Secondary | ICD-10-CM | POA: Diagnosis not present

## 2017-08-19 ENCOUNTER — Other Ambulatory Visit: Payer: Self-pay | Admitting: Obstetrics and Gynecology

## 2017-08-19 ENCOUNTER — Ambulatory Visit
Admission: RE | Admit: 2017-08-19 | Discharge: 2017-08-19 | Disposition: A | Discharge status: Home / Self Care | Payer: BLUE CROSS/BLUE SHIELD | Source: Ambulatory Visit | Attending: Obstetrics and Gynecology | Admitting: Obstetrics and Gynecology

## 2017-08-19 DIAGNOSIS — N6310 Unspecified lump in the right breast, unspecified quadrant: Secondary | ICD-10-CM

## 2017-08-19 DIAGNOSIS — N611 Abscess of the breast and nipple: Secondary | ICD-10-CM

## 2017-08-23 DIAGNOSIS — N611 Abscess of the breast and nipple: Secondary | ICD-10-CM | POA: Diagnosis not present

## 2017-08-26 DIAGNOSIS — M542 Cervicalgia: Secondary | ICD-10-CM | POA: Diagnosis not present

## 2017-08-26 DIAGNOSIS — M9901 Segmental and somatic dysfunction of cervical region: Secondary | ICD-10-CM | POA: Diagnosis not present

## 2017-08-26 DIAGNOSIS — M9903 Segmental and somatic dysfunction of lumbar region: Secondary | ICD-10-CM | POA: Diagnosis not present

## 2017-08-26 DIAGNOSIS — M6080 Other myositis, unspecified site: Secondary | ICD-10-CM | POA: Diagnosis not present

## 2017-09-09 ENCOUNTER — Other Ambulatory Visit: Payer: BLUE CROSS/BLUE SHIELD

## 2017-09-09 ENCOUNTER — Inpatient Hospital Stay: Admission: RE | Admit: 2017-09-09 | Payer: BLUE CROSS/BLUE SHIELD | Source: Ambulatory Visit

## 2017-09-15 DIAGNOSIS — M542 Cervicalgia: Secondary | ICD-10-CM | POA: Diagnosis not present

## 2017-09-15 DIAGNOSIS — M9901 Segmental and somatic dysfunction of cervical region: Secondary | ICD-10-CM | POA: Diagnosis not present

## 2017-09-15 DIAGNOSIS — M6080 Other myositis, unspecified site: Secondary | ICD-10-CM | POA: Diagnosis not present

## 2017-09-15 DIAGNOSIS — M9903 Segmental and somatic dysfunction of lumbar region: Secondary | ICD-10-CM | POA: Diagnosis not present

## 2017-09-23 DIAGNOSIS — M542 Cervicalgia: Secondary | ICD-10-CM | POA: Diagnosis not present

## 2017-09-23 DIAGNOSIS — M9903 Segmental and somatic dysfunction of lumbar region: Secondary | ICD-10-CM | POA: Diagnosis not present

## 2017-09-23 DIAGNOSIS — M6080 Other myositis, unspecified site: Secondary | ICD-10-CM | POA: Diagnosis not present

## 2017-09-23 DIAGNOSIS — M9901 Segmental and somatic dysfunction of cervical region: Secondary | ICD-10-CM | POA: Diagnosis not present

## 2017-09-29 DIAGNOSIS — M6080 Other myositis, unspecified site: Secondary | ICD-10-CM | POA: Diagnosis not present

## 2017-09-29 DIAGNOSIS — M9901 Segmental and somatic dysfunction of cervical region: Secondary | ICD-10-CM | POA: Diagnosis not present

## 2017-09-29 DIAGNOSIS — M9903 Segmental and somatic dysfunction of lumbar region: Secondary | ICD-10-CM | POA: Diagnosis not present

## 2017-09-29 DIAGNOSIS — M542 Cervicalgia: Secondary | ICD-10-CM | POA: Diagnosis not present

## 2017-10-07 ENCOUNTER — Ambulatory Visit
Admission: RE | Admit: 2017-10-07 | Discharge: 2017-10-07 | Disposition: A | Discharge status: Home / Self Care | Payer: BLUE CROSS/BLUE SHIELD | Source: Ambulatory Visit | Attending: Obstetrics and Gynecology | Admitting: Obstetrics and Gynecology

## 2017-10-07 ENCOUNTER — Ambulatory Visit: Admission: RE | Admit: 2017-10-07 | Payer: BLUE CROSS/BLUE SHIELD | Source: Ambulatory Visit

## 2017-10-07 ENCOUNTER — Other Ambulatory Visit: Payer: Self-pay | Admitting: Obstetrics and Gynecology

## 2017-10-07 ENCOUNTER — Other Ambulatory Visit: Payer: BLUE CROSS/BLUE SHIELD

## 2017-10-07 DIAGNOSIS — N611 Abscess of the breast and nipple: Secondary | ICD-10-CM | POA: Diagnosis not present

## 2017-10-07 DIAGNOSIS — N6489 Other specified disorders of breast: Secondary | ICD-10-CM | POA: Diagnosis not present

## 2017-10-07 DIAGNOSIS — N6310 Unspecified lump in the right breast, unspecified quadrant: Secondary | ICD-10-CM | POA: Diagnosis not present

## 2017-10-12 LAB — AEROBIC/ANAEROBIC CULTURE W GRAM STAIN (SURGICAL/DEEP WOUND)

## 2017-10-12 LAB — AEROBIC/ANAEROBIC CULTURE (SURGICAL/DEEP WOUND)

## 2017-10-18 ENCOUNTER — Other Ambulatory Visit: Payer: BLUE CROSS/BLUE SHIELD

## 2017-10-18 ENCOUNTER — Ambulatory Visit
Admission: RE | Admit: 2017-10-18 | Discharge: 2017-10-18 | Disposition: A | Discharge status: Home / Self Care | Payer: BLUE CROSS/BLUE SHIELD | Source: Ambulatory Visit | Attending: Obstetrics and Gynecology | Admitting: Obstetrics and Gynecology

## 2017-10-18 DIAGNOSIS — M542 Cervicalgia: Secondary | ICD-10-CM | POA: Diagnosis not present

## 2017-10-18 DIAGNOSIS — M9903 Segmental and somatic dysfunction of lumbar region: Secondary | ICD-10-CM | POA: Diagnosis not present

## 2017-10-18 DIAGNOSIS — M6080 Other myositis, unspecified site: Secondary | ICD-10-CM | POA: Diagnosis not present

## 2017-10-18 DIAGNOSIS — N611 Abscess of the breast and nipple: Secondary | ICD-10-CM

## 2017-10-18 DIAGNOSIS — M9901 Segmental and somatic dysfunction of cervical region: Secondary | ICD-10-CM | POA: Diagnosis not present

## 2017-10-20 DIAGNOSIS — M9901 Segmental and somatic dysfunction of cervical region: Secondary | ICD-10-CM | POA: Diagnosis not present

## 2017-10-20 DIAGNOSIS — M9903 Segmental and somatic dysfunction of lumbar region: Secondary | ICD-10-CM | POA: Diagnosis not present

## 2017-10-20 DIAGNOSIS — M542 Cervicalgia: Secondary | ICD-10-CM | POA: Diagnosis not present

## 2017-10-20 DIAGNOSIS — M6080 Other myositis, unspecified site: Secondary | ICD-10-CM | POA: Diagnosis not present

## 2017-10-21 DIAGNOSIS — M542 Cervicalgia: Secondary | ICD-10-CM | POA: Diagnosis not present

## 2017-10-21 DIAGNOSIS — M9903 Segmental and somatic dysfunction of lumbar region: Secondary | ICD-10-CM | POA: Diagnosis not present

## 2017-10-21 DIAGNOSIS — M9901 Segmental and somatic dysfunction of cervical region: Secondary | ICD-10-CM | POA: Diagnosis not present

## 2017-10-21 DIAGNOSIS — M6080 Other myositis, unspecified site: Secondary | ICD-10-CM | POA: Diagnosis not present

## 2017-10-27 DIAGNOSIS — M542 Cervicalgia: Secondary | ICD-10-CM | POA: Diagnosis not present

## 2017-10-27 DIAGNOSIS — M9901 Segmental and somatic dysfunction of cervical region: Secondary | ICD-10-CM | POA: Diagnosis not present

## 2017-10-27 DIAGNOSIS — M9903 Segmental and somatic dysfunction of lumbar region: Secondary | ICD-10-CM | POA: Diagnosis not present

## 2017-10-27 DIAGNOSIS — M6080 Other myositis, unspecified site: Secondary | ICD-10-CM | POA: Diagnosis not present

## 2017-11-03 DIAGNOSIS — M9901 Segmental and somatic dysfunction of cervical region: Secondary | ICD-10-CM | POA: Diagnosis not present

## 2017-11-03 DIAGNOSIS — M9903 Segmental and somatic dysfunction of lumbar region: Secondary | ICD-10-CM | POA: Diagnosis not present

## 2017-11-03 DIAGNOSIS — M6080 Other myositis, unspecified site: Secondary | ICD-10-CM | POA: Diagnosis not present

## 2017-11-03 DIAGNOSIS — M542 Cervicalgia: Secondary | ICD-10-CM | POA: Diagnosis not present

## 2017-11-30 DIAGNOSIS — E1122 Type 2 diabetes mellitus with diabetic chronic kidney disease: Secondary | ICD-10-CM | POA: Diagnosis not present

## 2017-11-30 DIAGNOSIS — E162 Hypoglycemia, unspecified: Secondary | ICD-10-CM | POA: Diagnosis not present

## 2017-11-30 DIAGNOSIS — F419 Anxiety disorder, unspecified: Secondary | ICD-10-CM | POA: Diagnosis not present

## 2017-12-08 DIAGNOSIS — M9903 Segmental and somatic dysfunction of lumbar region: Secondary | ICD-10-CM | POA: Diagnosis not present

## 2017-12-08 DIAGNOSIS — M6080 Other myositis, unspecified site: Secondary | ICD-10-CM | POA: Diagnosis not present

## 2017-12-08 DIAGNOSIS — M542 Cervicalgia: Secondary | ICD-10-CM | POA: Diagnosis not present

## 2017-12-08 DIAGNOSIS — M9901 Segmental and somatic dysfunction of cervical region: Secondary | ICD-10-CM | POA: Diagnosis not present

## 2017-12-09 DIAGNOSIS — M9903 Segmental and somatic dysfunction of lumbar region: Secondary | ICD-10-CM | POA: Diagnosis not present

## 2017-12-09 DIAGNOSIS — M6080 Other myositis, unspecified site: Secondary | ICD-10-CM | POA: Diagnosis not present

## 2017-12-09 DIAGNOSIS — M9901 Segmental and somatic dysfunction of cervical region: Secondary | ICD-10-CM | POA: Diagnosis not present

## 2017-12-09 DIAGNOSIS — M542 Cervicalgia: Secondary | ICD-10-CM | POA: Diagnosis not present

## 2017-12-13 DIAGNOSIS — M9903 Segmental and somatic dysfunction of lumbar region: Secondary | ICD-10-CM | POA: Diagnosis not present

## 2017-12-13 DIAGNOSIS — M9901 Segmental and somatic dysfunction of cervical region: Secondary | ICD-10-CM | POA: Diagnosis not present

## 2017-12-13 DIAGNOSIS — M542 Cervicalgia: Secondary | ICD-10-CM | POA: Diagnosis not present

## 2017-12-13 DIAGNOSIS — M6080 Other myositis, unspecified site: Secondary | ICD-10-CM | POA: Diagnosis not present

## 2017-12-15 DIAGNOSIS — M542 Cervicalgia: Secondary | ICD-10-CM | POA: Diagnosis not present

## 2017-12-15 DIAGNOSIS — M9903 Segmental and somatic dysfunction of lumbar region: Secondary | ICD-10-CM | POA: Diagnosis not present

## 2017-12-15 DIAGNOSIS — M6080 Other myositis, unspecified site: Secondary | ICD-10-CM | POA: Diagnosis not present

## 2017-12-15 DIAGNOSIS — M9901 Segmental and somatic dysfunction of cervical region: Secondary | ICD-10-CM | POA: Diagnosis not present

## 2017-12-22 DIAGNOSIS — M6080 Other myositis, unspecified site: Secondary | ICD-10-CM | POA: Diagnosis not present

## 2017-12-22 DIAGNOSIS — M9901 Segmental and somatic dysfunction of cervical region: Secondary | ICD-10-CM | POA: Diagnosis not present

## 2017-12-22 DIAGNOSIS — M9903 Segmental and somatic dysfunction of lumbar region: Secondary | ICD-10-CM | POA: Diagnosis not present

## 2017-12-22 DIAGNOSIS — M542 Cervicalgia: Secondary | ICD-10-CM | POA: Diagnosis not present

## 2017-12-28 ENCOUNTER — Observation Stay (HOSPITAL_COMMUNITY)
Admission: EM | Admit: 2017-12-28 | Discharge: 2017-12-30 | Disposition: A | Discharge status: Home / Self Care | Payer: BLUE CROSS/BLUE SHIELD | Attending: General Surgery | Admitting: General Surgery

## 2017-12-28 ENCOUNTER — Other Ambulatory Visit: Payer: Self-pay | Admitting: Family Medicine

## 2017-12-28 ENCOUNTER — Other Ambulatory Visit: Payer: Self-pay

## 2017-12-28 ENCOUNTER — Encounter (HOSPITAL_COMMUNITY): Payer: Self-pay | Admitting: Emergency Medicine

## 2017-12-28 DIAGNOSIS — K3580 Unspecified acute appendicitis: Principal | ICD-10-CM | POA: Insufficient documentation

## 2017-12-28 DIAGNOSIS — K3589 Other acute appendicitis without perforation or gangrene: Secondary | ICD-10-CM

## 2017-12-28 DIAGNOSIS — R109 Unspecified abdominal pain: Secondary | ICD-10-CM | POA: Diagnosis not present

## 2017-12-28 DIAGNOSIS — R918 Other nonspecific abnormal finding of lung field: Secondary | ICD-10-CM | POA: Diagnosis not present

## 2017-12-28 DIAGNOSIS — F17200 Nicotine dependence, unspecified, uncomplicated: Secondary | ICD-10-CM | POA: Diagnosis not present

## 2017-12-28 DIAGNOSIS — E119 Type 2 diabetes mellitus without complications: Secondary | ICD-10-CM | POA: Diagnosis not present

## 2017-12-28 DIAGNOSIS — Z79899 Other long term (current) drug therapy: Secondary | ICD-10-CM | POA: Diagnosis not present

## 2017-12-28 DIAGNOSIS — R112 Nausea with vomiting, unspecified: Secondary | ICD-10-CM | POA: Diagnosis not present

## 2017-12-28 DIAGNOSIS — R1110 Vomiting, unspecified: Secondary | ICD-10-CM | POA: Diagnosis not present

## 2017-12-28 DIAGNOSIS — Z91013 Allergy to seafood: Secondary | ICD-10-CM | POA: Insufficient documentation

## 2017-12-28 DIAGNOSIS — Z880 Allergy status to penicillin: Secondary | ICD-10-CM | POA: Insufficient documentation

## 2017-12-28 DIAGNOSIS — R1030 Lower abdominal pain, unspecified: Secondary | ICD-10-CM | POA: Diagnosis not present

## 2017-12-28 DIAGNOSIS — I700 Atherosclerosis of aorta: Secondary | ICD-10-CM | POA: Diagnosis not present

## 2017-12-28 HISTORY — DX: Migraine, unspecified, not intractable, without status migrainosus: G43.909

## 2017-12-28 HISTORY — DX: Basal cell carcinoma of skin, unspecified: C44.91

## 2017-12-28 HISTORY — DX: Pure hypercholesterolemia, unspecified: E78.00

## 2017-12-28 HISTORY — DX: Anxiety disorder, unspecified: F41.9

## 2017-12-28 HISTORY — DX: Gastro-esophageal reflux disease without esophagitis: K21.9

## 2017-12-28 HISTORY — DX: Type 2 diabetes mellitus without complications: E11.9

## 2017-12-28 HISTORY — DX: Unspecified asthma, uncomplicated: J45.909

## 2017-12-28 HISTORY — DX: Family history of other specified conditions: Z84.89

## 2017-12-28 LAB — URINALYSIS, ROUTINE W REFLEX MICROSCOPIC
BILIRUBIN URINE: NEGATIVE
Glucose, UA: NEGATIVE mg/dL
KETONES UR: NEGATIVE mg/dL
Nitrite: NEGATIVE
PH: 6 (ref 5.0–8.0)
PROTEIN: NEGATIVE mg/dL
Specific Gravity, Urine: 1.012 (ref 1.005–1.030)

## 2017-12-28 LAB — COMPREHENSIVE METABOLIC PANEL
ALT: 14 U/L (ref 0–44)
AST: 13 U/L — ABNORMAL LOW (ref 15–41)
Albumin: 3.6 g/dL (ref 3.5–5.0)
Alkaline Phosphatase: 67 U/L (ref 38–126)
Anion gap: 13 (ref 5–15)
BUN: 6 mg/dL (ref 6–20)
CHLORIDE: 99 mmol/L (ref 98–111)
CO2: 22 mmol/L (ref 22–32)
Calcium: 9.3 mg/dL (ref 8.9–10.3)
Creatinine, Ser: 0.91 mg/dL (ref 0.44–1.00)
Glucose, Bld: 216 mg/dL — ABNORMAL HIGH (ref 70–99)
Potassium: 3.7 mmol/L (ref 3.5–5.1)
Sodium: 134 mmol/L — ABNORMAL LOW (ref 135–145)
Total Bilirubin: 0.9 mg/dL (ref 0.3–1.2)
Total Protein: 6.9 g/dL (ref 6.5–8.1)

## 2017-12-28 LAB — CBC
HCT: 45.4 % (ref 36.0–46.0)
HEMOGLOBIN: 15.3 g/dL — AB (ref 12.0–15.0)
MCH: 31.4 pg (ref 26.0–34.0)
MCHC: 33.7 g/dL (ref 30.0–36.0)
MCV: 93.2 fL (ref 78.0–100.0)
Platelets: 255 10*3/uL (ref 150–400)
RBC: 4.87 MIL/uL (ref 3.87–5.11)
RDW: 13.5 % (ref 11.5–15.5)
WBC: 22.5 10*3/uL — ABNORMAL HIGH (ref 4.0–10.5)

## 2017-12-28 LAB — LIPASE, BLOOD: LIPASE: 26 U/L (ref 11–51)

## 2017-12-28 MED ORDER — IOPAMIDOL (ISOVUE-300) INJECTION 61%
INTRAVENOUS | Status: AC
  Filled 2017-12-28: qty 100

## 2017-12-28 NOTE — ED Triage Notes (Signed)
Patient reports mid/low abdominal pain with emesis and diarrhea onset yesterday , seen by her PCP today advised to go to ER to rule out appendicitis .

## 2017-12-28 NOTE — ED Provider Notes (Signed)
Patient placed in Quick Look pathway, seen and evaluated   Chief Complaint: abd pain, NVD  HPI:   Pt is a 52 y/o female with a h/o T2DM (not on meds) who presents to the ED today c/o NVD and abdominal pain that began last night. States pain is located to LLQ and RLQ. States she was seen by PCP PTA and was told to come to the ED to rule out appendicitis. She had lab work drawn and was told that her WBC was 25,000.   ROS: abd pain, NVD (one)  Physical Exam:   Gen: No distress  Neuro: Awake and Alert  Skin: Warm    Focused Exam: + BS in all 4 quadrants. TTP to the LLQ and RLQ, worse to RLQ. +rebound on right. + guarding.   Initiation of care has begun. The patient has been counseled on the process, plan, and necessity for staying for the completion/evaluation, and the remainder of the medical screening examination  Pt advised to inform nursing staff immediately if they experience any new or worsening of symptoms while waiting in the waiting room.    Bishop Dublin 12/28/17 1939    Carmin Muskrat, MD 12/30/17 [phone]

## 2017-12-29 ENCOUNTER — Other Ambulatory Visit: Payer: Self-pay

## 2017-12-29 ENCOUNTER — Encounter (HOSPITAL_COMMUNITY)
Admission: EM | Disposition: A | Discharge status: Home / Self Care | Payer: Self-pay | Source: Home / Self Care | Attending: Emergency Medicine

## 2017-12-29 ENCOUNTER — Observation Stay (HOSPITAL_COMMUNITY): Payer: BLUE CROSS/BLUE SHIELD | Admitting: Anesthesiology

## 2017-12-29 ENCOUNTER — Emergency Department (HOSPITAL_COMMUNITY): Payer: BLUE CROSS/BLUE SHIELD

## 2017-12-29 ENCOUNTER — Encounter (HOSPITAL_COMMUNITY): Payer: Self-pay | Admitting: Emergency Medicine

## 2017-12-29 ENCOUNTER — Other Ambulatory Visit: Payer: BLUE CROSS/BLUE SHIELD

## 2017-12-29 DIAGNOSIS — R112 Nausea with vomiting, unspecified: Secondary | ICD-10-CM | POA: Diagnosis not present

## 2017-12-29 DIAGNOSIS — K3580 Unspecified acute appendicitis: Secondary | ICD-10-CM | POA: Diagnosis not present

## 2017-12-29 DIAGNOSIS — R1110 Vomiting, unspecified: Secondary | ICD-10-CM | POA: Diagnosis not present

## 2017-12-29 DIAGNOSIS — R109 Unspecified abdominal pain: Secondary | ICD-10-CM | POA: Diagnosis not present

## 2017-12-29 HISTORY — PX: LAPAROSCOPIC APPENDECTOMY: SHX408

## 2017-12-29 HISTORY — PX: APPENDECTOMY: SHX54

## 2017-12-29 LAB — GLUCOSE, CAPILLARY
GLUCOSE-CAPILLARY: 155 mg/dL — AB (ref 70–99)
Glucose-Capillary: 180 mg/dL — ABNORMAL HIGH (ref 70–99)

## 2017-12-29 LAB — SURGICAL PCR SCREEN
MRSA, PCR: NEGATIVE
Staphylococcus aureus: NEGATIVE

## 2017-12-29 SURGERY — APPENDECTOMY, LAPAROSCOPIC
Anesthesia: General | Site: Abdomen

## 2017-12-29 MED ORDER — MIDAZOLAM HCL 5 MG/5ML IJ SOLN
INTRAMUSCULAR | Status: DC | PRN
  Administered 2017-12-29: 2 mg via INTRAVENOUS

## 2017-12-29 MED ORDER — IOPAMIDOL (ISOVUE-300) INJECTION 61%
100.0000 mL | Freq: Once | INTRAVENOUS | Status: AC | PRN
  Administered 2017-12-29: 100 mL via INTRAVENOUS

## 2017-12-29 MED ORDER — LACTATED RINGERS IV SOLN
INTRAVENOUS | Status: DC
  Administered 2017-12-29 (×2): via INTRAVENOUS

## 2017-12-29 MED ORDER — PROPOFOL 10 MG/ML IV BOLUS
INTRAVENOUS | Status: AC
  Filled 2017-12-29: qty 40

## 2017-12-29 MED ORDER — LIDOCAINE 2% (20 MG/ML) 5 ML SYRINGE
INTRAMUSCULAR | Status: DC | PRN
  Administered 2017-12-29: 100 mg via INTRAVENOUS

## 2017-12-29 MED ORDER — ACETAMINOPHEN 500 MG PO TABS
1000.0000 mg | ORAL_TABLET | ORAL | Status: AC
  Administered 2017-12-29: 1000 mg via ORAL
  Filled 2017-12-29: qty 2

## 2017-12-29 MED ORDER — ONDANSETRON HCL 4 MG/2ML IJ SOLN
4.0000 mg | Freq: Once | INTRAMUSCULAR | Status: AC
  Administered 2017-12-29: 4 mg via INTRAVENOUS
  Filled 2017-12-29: qty 2

## 2017-12-29 MED ORDER — PHENYLEPHRINE 40 MCG/ML (10ML) SYRINGE FOR IV PUSH (FOR BLOOD PRESSURE SUPPORT)
PREFILLED_SYRINGE | INTRAVENOUS | Status: AC
  Filled 2017-12-29: qty 10

## 2017-12-29 MED ORDER — ONDANSETRON HCL 4 MG/2ML IJ SOLN: 4.0000 mg | Freq: Four times a day (QID) | INTRAMUSCULAR | Status: DC | PRN

## 2017-12-29 MED ORDER — PIPERACILLIN-TAZOBACTAM 3.375 G IVPB 30 MIN
3.3750 g | Freq: Once | INTRAVENOUS | Status: AC
  Administered 2017-12-29: 3.375 g via INTRAVENOUS
  Filled 2017-12-29: qty 50

## 2017-12-29 MED ORDER — MEPERIDINE HCL 50 MG/ML IJ SOLN: 6.2500 mg | INTRAMUSCULAR | Status: DC | PRN

## 2017-12-29 MED ORDER — CELECOXIB 200 MG PO CAPS
400.0000 mg | ORAL_CAPSULE | ORAL | Status: AC
  Administered 2017-12-29: 400 mg via ORAL
  Filled 2017-12-29: qty 2

## 2017-12-29 MED ORDER — ROCURONIUM BROMIDE 10 MG/ML (PF) SYRINGE
PREFILLED_SYRINGE | INTRAVENOUS | Status: DC | PRN
  Administered 2017-12-29: 50 mg via INTRAVENOUS
  Administered 2017-12-29: 10 mg via INTRAVENOUS

## 2017-12-29 MED ORDER — LACTATED RINGERS IV SOLN: INTRAVENOUS | Status: DC

## 2017-12-29 MED ORDER — PROPOFOL 10 MG/ML IV BOLUS
INTRAVENOUS | Status: DC | PRN
  Administered 2017-12-29: 200 mg via INTRAVENOUS

## 2017-12-29 MED ORDER — MIDAZOLAM HCL 2 MG/2ML IJ SOLN
INTRAMUSCULAR | Status: AC
  Filled 2017-12-29: qty 2

## 2017-12-29 MED ORDER — SUCCINYLCHOLINE CHLORIDE 200 MG/10ML IV SOSY
PREFILLED_SYRINGE | INTRAVENOUS | Status: DC | PRN
  Administered 2017-12-29: 100 mg via INTRAVENOUS

## 2017-12-29 MED ORDER — ESMOLOL HCL 100 MG/10ML IV SOLN
INTRAVENOUS | Status: AC
  Filled 2017-12-29: qty 10

## 2017-12-29 MED ORDER — ESMOLOL HCL 100 MG/10ML IV SOLN
INTRAVENOUS | Status: DC | PRN
  Administered 2017-12-29: 20 mg via INTRAVENOUS

## 2017-12-29 MED ORDER — PHENYLEPHRINE 40 MCG/ML (10ML) SYRINGE FOR IV PUSH (FOR BLOOD PRESSURE SUPPORT)
PREFILLED_SYRINGE | INTRAVENOUS | Status: DC | PRN
  Administered 2017-12-29: 80 ug via INTRAVENOUS

## 2017-12-29 MED ORDER — 0.9 % SODIUM CHLORIDE (POUR BTL) OPTIME
TOPICAL | Status: DC | PRN
  Administered 2017-12-29: 1000 mL

## 2017-12-29 MED ORDER — ONDANSETRON 4 MG PO TBDP: 4.0000 mg | ORAL_TABLET | Freq: Four times a day (QID) | ORAL | Status: DC | PRN

## 2017-12-29 MED ORDER — FENTANYL CITRATE (PF) 250 MCG/5ML IJ SOLN
INTRAMUSCULAR | Status: DC | PRN
  Administered 2017-12-29: 50 ug via INTRAVENOUS
  Administered 2017-12-29: 100 ug via INTRAVENOUS
  Administered 2017-12-29 (×2): 50 ug via INTRAVENOUS

## 2017-12-29 MED ORDER — SUCCINYLCHOLINE CHLORIDE 200 MG/10ML IV SOSY
PREFILLED_SYRINGE | INTRAVENOUS | Status: AC
  Filled 2017-12-29: qty 10

## 2017-12-29 MED ORDER — KCL IN DEXTROSE-NACL 40-5-0.9 MEQ/L-%-% IV SOLN
INTRAVENOUS | Status: DC
  Administered 2017-12-29 (×2): via INTRAVENOUS
  Filled 2017-12-29 (×4): qty 1000

## 2017-12-29 MED ORDER — HYDROCODONE-ACETAMINOPHEN 5-325 MG PO TABS
1.0000 | ORAL_TABLET | ORAL | Status: DC | PRN
  Administered 2017-12-29: 2 via ORAL
  Administered 2017-12-29: 1 via ORAL
  Filled 2017-12-29: qty 1
  Filled 2017-12-29: qty 2

## 2017-12-29 MED ORDER — FENTANYL CITRATE (PF) 250 MCG/5ML IJ SOLN
INTRAMUSCULAR | Status: AC
  Filled 2017-12-29: qty 5

## 2017-12-29 MED ORDER — SODIUM CHLORIDE 0.9 % IV BOLUS
1000.0000 mL | Freq: Once | INTRAVENOUS | Status: AC
  Administered 2017-12-29: 1000 mL via INTRAVENOUS

## 2017-12-29 MED ORDER — BUPIVACAINE-EPINEPHRINE 0.25% -1:200000 IJ SOLN
INTRAMUSCULAR | Status: DC | PRN
  Administered 2017-12-29: 16 mL

## 2017-12-29 MED ORDER — HYDROMORPHONE HCL 1 MG/ML IJ SOLN: 1.0000 mg | INTRAMUSCULAR | Status: DC | PRN

## 2017-12-29 MED ORDER — CLONAZEPAM 1 MG PO TABS
1.0000 mg | ORAL_TABLET | Freq: Every day | ORAL | Status: DC
  Administered 2017-12-29: 1 mg via ORAL
  Filled 2017-12-29: qty 1

## 2017-12-29 MED ORDER — GABAPENTIN 300 MG PO CAPS
300.0000 mg | ORAL_CAPSULE | ORAL | Status: AC
  Administered 2017-12-29: 300 mg via ORAL
  Filled 2017-12-29: qty 1

## 2017-12-29 MED ORDER — KETOROLAC TROMETHAMINE 30 MG/ML IJ SOLN
INTRAMUSCULAR | Status: AC
  Filled 2017-12-29: qty 1

## 2017-12-29 MED ORDER — FENTANYL CITRATE (PF) 100 MCG/2ML IJ SOLN: 25.0000 ug | INTRAMUSCULAR | Status: DC | PRN

## 2017-12-29 MED ORDER — LIDOCAINE 2% (20 MG/ML) 5 ML SYRINGE
INTRAMUSCULAR | Status: AC
  Filled 2017-12-29: qty 5

## 2017-12-29 MED ORDER — MORPHINE SULFATE (PF) 4 MG/ML IV SOLN
4.0000 mg | Freq: Once | INTRAVENOUS | Status: AC
  Administered 2017-12-29: 4 mg via INTRAVENOUS
  Filled 2017-12-29: qty 1

## 2017-12-29 MED ORDER — METOCLOPRAMIDE HCL 5 MG/ML IJ SOLN: 10.0000 mg | Freq: Once | INTRAMUSCULAR | Status: DC | PRN

## 2017-12-29 MED ORDER — DEXAMETHASONE SODIUM PHOSPHATE 10 MG/ML IJ SOLN
INTRAMUSCULAR | Status: DC | PRN
  Administered 2017-12-29: 10 mg via INTRAVENOUS

## 2017-12-29 MED ORDER — SODIUM CHLORIDE 0.9 % IR SOLN
Status: DC | PRN
  Administered 2017-12-29: 1000 mL

## 2017-12-29 MED ORDER — ONDANSETRON HCL 4 MG/2ML IJ SOLN
INTRAMUSCULAR | Status: DC | PRN
  Administered 2017-12-29: 4 mg via INTRAVENOUS

## 2017-12-29 MED ORDER — KETOROLAC TROMETHAMINE 30 MG/ML IJ SOLN
INTRAMUSCULAR | Status: DC | PRN
  Administered 2017-12-29: 15 mg via INTRAVENOUS

## 2017-12-29 MED ORDER — SUGAMMADEX SODIUM 200 MG/2ML IV SOLN
INTRAVENOUS | Status: DC | PRN
  Administered 2017-12-29: 175 mg via INTRAVENOUS

## 2017-12-29 MED ORDER — STERILE WATER FOR IRRIGATION IR SOLN
Status: DC | PRN
  Administered 2017-12-29: 1000 mL

## 2017-12-29 MED ORDER — ONDANSETRON HCL 4 MG/2ML IJ SOLN
INTRAMUSCULAR | Status: AC
  Filled 2017-12-29: qty 2

## 2017-12-29 MED ORDER — ROCURONIUM BROMIDE 50 MG/5ML IV SOSY
PREFILLED_SYRINGE | INTRAVENOUS | Status: AC
  Filled 2017-12-29: qty 5

## 2017-12-29 MED ORDER — BUPIVACAINE-EPINEPHRINE (PF) 0.25% -1:200000 IJ SOLN
INTRAMUSCULAR | Status: AC
  Filled 2017-12-29: qty 30

## 2017-12-29 MED ORDER — DEXAMETHASONE SODIUM PHOSPHATE 10 MG/ML IJ SOLN
INTRAMUSCULAR | Status: AC
  Filled 2017-12-29: qty 1

## 2017-12-29 SURGICAL SUPPLY — 40 items
APPLIER CLIP ROT 10 11.4 M/L (STAPLE) ×2
BLADE CLIPPER SURG (BLADE) ×2 IMPLANT
CANISTER SUCT 3000ML PPV (MISCELLANEOUS) ×2 IMPLANT
CHLORAPREP W/TINT 26ML (MISCELLANEOUS) ×2 IMPLANT
CLIP APPLIE ROT 10 11.4 M/L (STAPLE) ×1 IMPLANT
COVER SURGICAL LIGHT HANDLE (MISCELLANEOUS) ×2 IMPLANT
CUTTER FLEX LINEAR 45M (STAPLE) IMPLANT
DERMABOND ADVANCED (GAUZE/BANDAGES/DRESSINGS) ×1
DERMABOND ADVANCED .7 DNX12 (GAUZE/BANDAGES/DRESSINGS) ×1 IMPLANT
ELECT REM PT RETURN 9FT ADLT (ELECTROSURGICAL) ×2
ELECTRODE REM PT RTRN 9FT ADLT (ELECTROSURGICAL) ×1 IMPLANT
GLOVE BIO SURGEON STRL SZ7.5 (GLOVE) ×2 IMPLANT
GLOVE BIOGEL PI IND STRL 6.5 (GLOVE) ×1 IMPLANT
GLOVE BIOGEL PI IND STRL 7.0 (GLOVE) ×1 IMPLANT
GLOVE BIOGEL PI INDICATOR 6.5 (GLOVE) ×1
GLOVE BIOGEL PI INDICATOR 7.0 (GLOVE) ×1
GLOVE SURG SS PI 6.0 STRL IVOR (GLOVE) ×2 IMPLANT
GLOVE SURG SS PI 6.5 STRL IVOR (GLOVE) ×4 IMPLANT
GLOVE SURG SS PI 7.0 STRL IVOR (GLOVE) ×2 IMPLANT
GOWN STRL REUS W/ TWL LRG LVL3 (GOWN DISPOSABLE) ×4 IMPLANT
GOWN STRL REUS W/TWL LRG LVL3 (GOWN DISPOSABLE) ×4
KIT BASIN OR (CUSTOM PROCEDURE TRAY) ×2 IMPLANT
KIT TURNOVER KIT B (KITS) ×2 IMPLANT
NS IRRIG 1000ML POUR BTL (IV SOLUTION) ×2 IMPLANT
PAD ARMBOARD 7.5X6 YLW CONV (MISCELLANEOUS) ×4 IMPLANT
POUCH SPECIMEN RETRIEVAL 10MM (ENDOMECHANICALS) ×2 IMPLANT
RELOAD STAPLER GREEN 60MM (STAPLE) ×3 IMPLANT
SET IRRIG TUBING LAPAROSCOPIC (IRRIGATION / IRRIGATOR) ×2 IMPLANT
SHEARS HARMONIC ACE PLUS 36CM (ENDOMECHANICALS) ×2 IMPLANT
SPECIMEN JAR SMALL (MISCELLANEOUS) ×2 IMPLANT
STAPLE ECHEON FLEX 60 POW ENDO (STAPLE) ×2 IMPLANT
STAPLER RELOAD GREEN 60MM (STAPLE) ×6
SUT MNCRL AB 4-0 PS2 18 (SUTURE) ×2 IMPLANT
TOWEL GREEN STERILE FF (TOWEL DISPOSABLE) ×2 IMPLANT
TRAY FOLEY CATH 16FR SILVER (SET/KITS/TRAYS/PACK) ×2 IMPLANT
TRAY LAPAROSCOPIC MC (CUSTOM PROCEDURE TRAY) ×2 IMPLANT
TROCAR XCEL BLUNT TIP 100MML (ENDOMECHANICALS) ×2 IMPLANT
TROCAR XCEL NON-BLD 5MMX100MML (ENDOMECHANICALS) ×4 IMPLANT
TUBING INSUFFLATION (TUBING) ×2 IMPLANT
WATER STERILE IRR 1000ML POUR (IV SOLUTION) ×2 IMPLANT

## 2017-12-29 NOTE — Anesthesia Preprocedure Evaluation (Signed)
Anesthesia Evaluation  Patient identified by MRN, date of birth, ID band Patient awake    Reviewed: Allergy & Precautions, NPO status , Patient's Chart, lab work & pertinent test results  Airway Mallampati: II  TM Distance: >3 FB Neck ROM: Full    Dental no notable dental hx.    Pulmonary Current Smoker,    Pulmonary exam normal breath sounds clear to auscultation       Cardiovascular negative cardio ROS Normal cardiovascular exam Rhythm:Regular Rate:Normal     Neuro/Psych negative neurological ROS  negative psych ROS   GI/Hepatic negative GI ROS, Neg liver ROS,   Endo/Other  negative endocrine ROS  Renal/GU negative Renal ROS  negative genitourinary   Musculoskeletal negative musculoskeletal ROS (+)   Abdominal   Peds negative pediatric ROS (+)  Hematology negative hematology ROS (+)   Anesthesia Other Findings   Reproductive/Obstetrics negative OB ROS                             Anesthesia Physical Anesthesia Plan  ASA: II  Anesthesia Plan: General   Post-op Pain Management:    Induction: Intravenous  PONV Risk Score and Plan: 4 or greater and Ondansetron, Dexamethasone, Midazolam, Scopolamine patch - Pre-op and Treatment may vary due to age or medical condition  Airway Management Planned: Oral ETT  Additional Equipment:   Intra-op Plan:   Post-operative Plan: Extubation in OR  Informed Consent: I have reviewed the patients History and Physical, chart, labs and discussed the procedure including the risks, benefits and alternatives for the proposed anesthesia with the patient or authorized representative who has indicated his/her understanding and acceptance.   Dental advisory given  Plan Discussed with: CRNA  Anesthesia Plan Comments:         Anesthesia Quick Evaluation

## 2017-12-29 NOTE — Transfer of Care (Signed)
Immediate Anesthesia Transfer of Care Note  Patient: Sydney Nelson  Procedure(s) Performed: APPENDECTOMY LAPAROSCOPIC (N/A Abdomen)  Patient Location: PACU  Anesthesia Type:General  Level of Consciousness: drowsy  Airway & Oxygen Therapy: Patient Spontanous Breathing and Patient connected to face mask oxygen  Post-op Assessment: Report given to RN, Post -op Vital signs reviewed and stable and Patient moving all extremities X 4  Post vital signs: Reviewed and stable  Last Vitals:  Vitals Value Taken Time  BP 79/39 12/29/2017 10:07 AM  Temp    Pulse 102 12/29/2017 10:10 AM  Resp 24 12/29/2017 10:10 AM  SpO2 93 % 12/29/2017 10:10 AM  Vitals shown include unvalidated device data.  Last Pain:  Vitals:   12/29/17 0754  TempSrc: Oral  PainSc:          Complications: No apparent anesthesia complications

## 2017-12-29 NOTE — Op Note (Signed)
12/29/2017  9:57 AM  PATIENT:  Sydney Nelson  52 y.o. female  PRE-OPERATIVE DIAGNOSIS:  appendicitis  POST-OPERATIVE DIAGNOSIS:  appendicitis  PROCEDURE:  Procedure(s): APPENDECTOMY LAPAROSCOPIC (N/A)  SURGEON:  Surgeon(s) and Role:    * Jovita Kussmaul, MD - Primary  PHYSICIAN ASSISTANT:   ASSISTANTS: none   ANESTHESIA:   local and general  EBL:  15 mL   BLOOD ADMINISTERED:none  DRAINS: none   LOCAL MEDICATIONS USED:  MARCAINE     SPECIMEN:  Source of Specimen:  appendix  DISPOSITION OF SPECIMEN:  PATHOLOGY  COUNTS:  YES  TOURNIQUET:  * No tourniquets in log *  DICTATION: .Dragon Dictation   After informed consent was obtained patient was brought to the operating room placed in the supine position on the operating room table. After adequate induction of general anesthesia the patient's abdomen was prepped with ChloraPrep, allowed to dry, and draped in usual sterile manner. An appropriate timeout was performed. The area below the umbilicus was infiltrated with quarter percent Marcaine. A small incision was made with a 15 blade knife. This incision was carried down through the subcutaneous tissue bluntly with a hemostat and [employer] retractors until the linea alba was identified. The linea alba was incised with a 15 blade knife. Each side was grasped Coker clamps and elevated anteriorly. The preperitoneal space was probed bluntly with a hemostat until the peritoneum was opened and access was gained to the abdominal cavity. A 0 Vicryl purse string stitch was placed in the fascia surrounding the opening. A Hassan cannula was placed through the opening and anchored in place with the previously placed Vicryl purse string stitch. The laparoscope was placed through the [facility] cannula. The abdomen was insufflated with carbon dioxide without difficulty. Next the suprapubic area was infiltrated with quarter percent Marcaine. A small incision was made with a 15 blade knife. A 5 mm port  was placed bluntly through this incision into the abdominal cavity. A site was then chosen between the 2 port for placement of a 5 mm port. The area was infiltrated with quarter percent Marcaine. A small stab incision was made with a 15 blade knife. A 5 mm port was placed bluntly through this incision and the abdominal cavity under direct vision. The laparoscope was then moved to the suprapubic port. Using a Glassman grasper and harmonic scalpel the right lower quadrant was inspected. The appendix was readily identified. The appendix was elevated anteriorly and the mesoappendix was taken down sharply with the harmonic scalpel. The appendix was severely inflamed throughout its length.Once the base of the appendix where it joined the cecum was identified and cleared of any tissue then a laparoscopic green load echelon stapler was placed through the [facility] cannula. The stapler was placed across the edge of the cecum because of the amount of inflamation, clamped and fired thereby dividing the cecum between staple lines. There appeared to be plenty of room between the staple line and the ileocecal valve. A laparoscopic bag was then inserted through the [facility] cannula. The appendix was placed within the bag and the bag was sealed. The abdomen was then irrigated with copious amounts of saline until the effluent was clear. No other abnormalities were noted. The appendix and bag were removed with the [facility] cannula through the infraumbilical port without difficulty. The fascial defect was closed with the previously placed Vicryl pursestring stitch as well as with another interrupted 0 Vicryl figure-of-eight stitch. The rest of the ports were removed under direct vision and  were found to be hemostatic. The gas was allowed to escape. The skin incisions were closed with interrupted 4-0 Monocryl subcuticular stitches. Dermabond dressings were applied. The patient tolerated the procedure well. At the end of the case all needle  sponge and instrument counts were correct. The patient was then awakened and taken to recovery in stable condition.  PLAN OF CARE: Admit for overnight observation  PATIENT DISPOSITION:  PACU - hemodynamically stable.   Delay start of Pharmacological VTE agent (>24hrs) due to surgical blood loss or risk of bleeding: no

## 2017-12-29 NOTE — Anesthesia Postprocedure Evaluation (Signed)
Anesthesia Post Note  Patient: Sydney Nelson  Procedure(s) Performed: APPENDECTOMY LAPAROSCOPIC (N/A Abdomen)     Patient location during evaluation: PACU Anesthesia Type: General Level of consciousness: awake and alert Pain management: pain level controlled Vital Signs Assessment: post-procedure vital signs reviewed and stable Respiratory status: spontaneous breathing, nonlabored ventilation, respiratory function stable and patient connected to nasal cannula oxygen Cardiovascular status: blood pressure returned to baseline and stable Postop Assessment: no apparent nausea or vomiting Anesthetic complications: no    Last Vitals:  Vitals:   12/29/17 1100 12/29/17 1115  BP: 98/67 97/66  Pulse: 77 74  Resp: 17 16  Temp:    SpO2: 95% 93%    Last Pain:  Vitals:   12/29/17 1100  TempSrc:   PainSc: 0-No pain                 Ruta Capece,W. EDMOND

## 2017-12-29 NOTE — ED Provider Notes (Signed)
[place] EMERGENCY DEPARTMENT Provider Note   CSN: [id number] Arrival date & time: 12/28/17  1922     History   Chief Complaint Chief Complaint  Patient presents with  . Abdominal Pain    HPI Sydney Nelson is a 52 y.o. female.  The history is provided by the patient and medical records.    52 y.o. F with hx of breast injury, presenting to the ED for abdominal pain.  States this started on Monday (2 days ago).  States pain diffuse across lower abdomen without radiation.  States initially she had nausea, vomiting, and diarrhea but that has since resolved. She still has a lot of nausea and appetite has been very poor.  Reports fevers at home and has been feeling warm with some intermittent sweats.  No urinary symptoms.  She is peri-menopausal.  No prior abdominal surgeries.  Seen at PCP office earlier today and given medications with some relief, however have worn off at this time.  Past Medical History:  Diagnosis Date  . Breast discharge    clear to milky/ one episode bloody  . Breast injury 06/25/2016    There are no active problems to display for this patient.   History reviewed. No pertinent surgical history.   OB History   None      Home Medications    Prior to Admission medications   Not on File    Family History No family history on file.  Social History Social History   Tobacco Use  . Smoking status: Never Smoker  . Smokeless tobacco: Never Used  Substance Use Topics  . Alcohol use: Yes  . Drug use: Never     Allergies   Patient has no known allergies.   Review of Systems Review of Systems  Gastrointestinal: Positive for abdominal pain.  All other systems reviewed and are negative.    Physical Exam Updated Vital Signs BP 118/89 (BP Location: Right Arm)   Pulse 97   Temp 99.1 F (37.3 C) (Oral)   Resp 18   SpO2 97%   Physical Exam  Constitutional: She is oriented to person, place, and time. She appears  well-developed and well-nourished.  HENT:  Head: Normocephalic and atraumatic.  Mouth/Throat: Oropharynx is clear and moist.  Eyes: Pupils are equal, round, and reactive to light. Conjunctivae and EOM are normal.  Neck: Normal range of motion.  Cardiovascular: Normal rate, regular rhythm and normal heart sounds.  Pulmonary/Chest: Effort normal and breath sounds normal.  Abdominal: Soft. Bowel sounds are normal. There is tenderness in the right lower quadrant, suprapubic area and left lower quadrant.  Tenderness across lower abdomen, pain appears worsened with coughing and moving during exam  Musculoskeletal: Normal range of motion.  Neurological: She is alert and oriented to person, place, and time.  Skin: Skin is warm and dry.  Psychiatric: She has a normal mood and affect.  Nursing note and vitals reviewed.    ED Treatments / Results  Labs (all labs ordered are listed, but only abnormal results are displayed) Labs Reviewed  COMPREHENSIVE METABOLIC PANEL - Abnormal; Notable for the following components:      Result Value   Sodium 134 (*)    Glucose, Bld 216 (*)    AST 13 (*)    All other components within normal limits  CBC - Abnormal; Notable for the following components:   WBC 22.5 (*)    Hemoglobin 15.3 (*)    All other components within normal limits  URINALYSIS, ROUTINE W REFLEX MICROSCOPIC - Abnormal; Notable for the following components:   Color, Urine AMBER (*)    APPearance CLOUDY (*)    Hgb urine dipstick SMALL (*)    Leukocytes, UA SMALL (*)    Bacteria, UA RARE (*)    All other components within normal limits  LIPASE, BLOOD    EKG None  Radiology Ct Abdomen Pelvis W Contrast  Result Date: 12/29/2017 CLINICAL DATA:  Mid to lower abdominal pain, nausea and vomiting, started yesterday. EXAM: CT ABDOMEN AND PELVIS WITH CONTRAST TECHNIQUE: Multidetector CT imaging of the abdomen and pelvis was performed using the standard protocol following bolus administration  of intravenous contrast. CONTRAST:  146mL ISOVUE-300 IOPAMIDOL (ISOVUE-300) INJECTION 61% COMPARISON:  None. FINDINGS: Lower chest: Infiltration in the left lung base, possibly pneumonia. Right cardiophrenic angle cyst, probably a pericardial cyst. Hepatobiliary: No focal liver abnormality is seen. No gallstones, gallbladder wall thickening, or biliary dilatation. Pancreas: Unremarkable. No pancreatic ductal dilatation or surrounding inflammatory changes. Spleen: Normal in size without focal abnormality. Adrenals/Urinary Tract: Adrenal glands are unremarkable. Kidneys are normal, without renal calculi, focal lesion, or hydronephrosis. Bladder is unremarkable. Stomach/Bowel: Stomach, small bowel, and colon are not abnormally distended. Significant appendiceal dilatation with diameter measuring up to about 12 mm. Prominent periappendiceal stranding and edema. Changes are consistent with acute appendicitis. Appendix: Location: Medial to the cecum and extending towards the midline in the posterior pelvis Diameter: 12 mm Appendicolith: No Mucosal hyper-enhancement: Yes Extraluminal gas: No Periappendiceal collection: No loculated collection. Significant stranding and edema. Vascular/Lymphatic: Aortic atherosclerosis. No enlarged abdominal or pelvic lymph nodes. Reproductive: Uterus and bilateral adnexa are unremarkable. Other: No free air or free fluid in the abdomen. Abdominal wall musculature appears intact. Musculoskeletal: No acute or significant osseous findings. IMPRESSION: 1. Acute appendicitis with appendiceal dilatation and periappendiceal stranding. No evidence of perforation or abscess. 2. Infiltration in the left lung base may indicate pneumonia. Aortic Atherosclerosis (ICD10-I70.0). Electronically Signed   By: Lucienne Capers M.D.   On: 12/29/2017 02:24    Procedures Procedures (including critical care time)  Medications Ordered in ED Medications  iopamidol (ISOVUE-300) 61 % injection (has no  administration in time range)  piperacillin-tazobactam (ZOSYN) IVPB 3.375 g (has no administration in time range)  morphine 4 MG/ML injection 4 mg (4 mg Intravenous Given 12/29/17 0048)  ondansetron (ZOFRAN) injection 4 mg (4 mg Intravenous Given 12/29/17 0048)  sodium chloride 0.9 % bolus 1,000 mL (0 mLs Intravenous Stopped 12/29/17 0243)  iopamidol (ISOVUE-300) 61 % injection 100 mL (100 mLs Intravenous Contrast Given 12/29/17 0121)  morphine 4 MG/ML injection 4 mg (4 mg Intravenous Given 12/29/17 0243)     Initial Impression / Assessment and Plan / ED Course  I have reviewed the triage vital signs and the nursing notes.  Pertinent labs & imaging results that were available during my care of the patient were reviewed by me and considered in my medical decision making (see chart for details).  52 year old female here with abdominal pain.  Began 2 days ago, has been progressively worsening.  She reports pain all across her lower abdomen without radiation.  Initially had some nausea, vomiting, and diarrhea but that has since resolved.  Appetite has been poor.  She is afebrile and nontoxic.  Does have tenderness throughout the abdomen without peritoneal signs.  Pain does seem exacerbated with movement and coughing during exam.  Screening labs reviewed, does have significant leukocytosis.  CT scan pending.  Patient given pain and nausea medications here.  CT scan with findings of acute appendicitis, no abscess or perforation.  Patient's pain is starting to return, given additional pain medication.  She was started on Zosyn.  Discussed with general surgery, Dr. Adolphus Birchwood evaluate in the ED and admit.  Final Clinical Impressions(s) / ED Diagnoses   Final diagnoses:  Abdominal pain, unspecified abdominal location  Other acute appendicitis    ED Discharge Orders    None       Larene Pickett, PA-C 12/29/17 0309    Merryl Hacker, MD 12/29/17 [phone]

## 2017-12-29 NOTE — Interval H&P Note (Signed)
History and Physical Interval Note:  12/29/2017 8:17 AM  Sydney Nelson  has presented today for surgery, with the diagnosis of appendicitis  The various methods of treatment have been discussed with the patient and family. After consideration of risks, benefits and other options for treatment, the patient has consented to  Procedure(s): APPENDECTOMY LAPAROSCOPIC (N/A) as a surgical intervention .  The patient's history has been reviewed, patient examined, no change in status, stable for surgery.  I have reviewed the patient's chart and labs.  Questions were answered to the patient's satisfaction.     TOTH III,PAUL S

## 2017-12-29 NOTE — Anesthesia Procedure Notes (Signed)
Procedure Name: Intubation Date/Time: 12/29/2017 8:41 AM Performed by: Colin Benton, CRNA Pre-anesthesia Checklist: Emergency Drugs available, Suction available, Patient being monitored and Patient identified Patient Re-evaluated:Patient Re-evaluated prior to induction Oxygen Delivery Method: Circle system utilized Preoxygenation: Pre-oxygenation with 100% oxygen Induction Type: IV induction and Rapid sequence Laryngoscope Size: Miller and 2 Grade View: Grade I Tube type: Oral Tube size: 7.0 mm Number of attempts: 1 Airway Equipment and Method: Stylet Placement Confirmation: ETT inserted through vocal cords under direct vision,  positive ETCO2 and breath sounds checked- equal and bilateral Secured at: 21 cm Tube secured with: Tape Dental Injury: Teeth and Oropharynx as per pre-operative assessment

## 2017-12-29 NOTE — H&P (Signed)
Sydney Nelson is an 52 y.o. female.   Chief Complaint: Abdominal pain HPI: Patient is a 52 year old female with a 3 to 4-day history of abdominal pain that began generalized.  She states that the pain localized to the lower abdomen.  She states that initially she began with nausea, vomiting, diarrhea.  She denies any fevers at home.  Patient states that secondary to continued abdominal pain she proceeded to the ER for further evaluation and management.  Upon evaluation the ED she underwent CT scan which did reveal acute appendicitis without any rupture.  I did review this CT scan personally.  General surgery was consulted for further evaluation and management.  Past Medical History:  Diagnosis Date  . Breast discharge    clear to milky/ one episode bloody  . Breast injury 06/25/2016    History reviewed. No pertinent surgical history.  No family history on file. Social History:  reports that she has never smoked. She has never used smokeless tobacco. She reports that she drinks alcohol. She reports that she does not use drugs.  Allergies:  Allergies  Allergen Reactions  . Penicillins Anaphylaxis  . Shellfish Allergy Anaphylaxis     (Not in a hospital admission)  Results for orders placed or performed during the hospital encounter of 12/28/17 (from the past 48 hour(s))  Urinalysis, Routine w reflex microscopic     Status: Abnormal   Collection Time: 12/28/17  7:35 PM  Result Value Ref Range   Color, Urine AMBER (A) YELLOW    Comment: BIOCHEMICALS MAY BE AFFECTED BY COLOR   APPearance CLOUDY (A) CLEAR   Specific Gravity, Urine 1.012 1.005 - 1.030   pH 6.0 5.0 - 8.0   Glucose, UA NEGATIVE NEGATIVE mg/dL   Hgb urine dipstick SMALL (A) NEGATIVE   Bilirubin Urine NEGATIVE NEGATIVE   Ketones, ur NEGATIVE NEGATIVE mg/dL   Protein, ur NEGATIVE NEGATIVE mg/dL   Nitrite NEGATIVE NEGATIVE   Leukocytes, UA SMALL (A) NEGATIVE   RBC / HPF 0-5 0 - 5 RBC/hpf   WBC, UA 11-20 0 - 5  WBC/hpf   Bacteria, UA RARE (A) NONE SEEN   Squamous Epithelial / LPF 21-50 0 - 5   Mucus PRESENT    Hyaline Casts, UA PRESENT     Comment: Performed at [facility], [address], [place], [place] [address]  Lipase, blood     Status: None   Collection Time: 12/28/17  7:42 PM  Result Value Ref Range   Lipase 26 11 - 51 U/L    Comment: Performed at [facility] [address] [place], [place] [address]  Comprehensive metabolic panel     Status: Abnormal   Collection Time: 12/28/17  7:42 PM  Result Value Ref Range   Sodium 134 (L) 135 - 145 mmol/L   Potassium 3.7 3.5 - 5.1 mmol/L   Chloride 99 98 - 111 mmol/L   CO2 22 22 - 32 mmol/L   Glucose, Bld 216 (H) 70 - 99 mg/dL   BUN 6 6 - 20 mg/dL   Creatinine, Ser 0.91 0.44 - 1.00 mg/dL   Calcium 9.3 8.9 - 10.3 mg/dL   Total Protein 6.9 6.5 - 8.1 g/dL   Albumin 3.6 3.5 - 5.0 g/dL   AST 13 (L) 15 - 41 U/L   ALT 14 0 - 44 U/L   Alkaline Phosphatase 67 38 - 126 U/L   Total Bilirubin 0.9 0.3 - 1.2 mg/dL   GFR calc non Af  Amer >60 >60 mL/min   GFR calc Af Amer >60 >60 mL/min    Comment: (NOTE) The eGFR has been calculated using the CKD EPI equation. This calculation has not been validated in all clinical situations. eGFR's persistently <60 mL/min signify possible Chronic Kidney Disease.    Anion gap 13 5 - 15    Comment: Performed at [facility] [address], [place], [place] [address]  CBC     Status: Abnormal   Collection Time: 12/28/17  7:42 PM  Result Value Ref Range   WBC 22.5 (H) 4.0 - 10.5 K/uL   RBC 4.87 3.87 - 5.11 MIL/uL   Hemoglobin 15.3 (H) 12.0 - 15.0 g/dL   HCT 45.4 36.0 - 46.0 %   MCV 93.2 78.0 - 100.0 fL   MCH 31.4 26.0 - 34.0 pg   MCHC 33.7 30.0 - 36.0 g/dL   RDW 13.5 11.5 - 15.5 %   Platelets 255 150 - 400 K/uL    Comment: Performed at [facility], [place] [address] [place], [place] [address]   Ct Abdomen Pelvis W Contrast  Result Date: 12/29/2017 CLINICAL DATA:  Mid to lower  abdominal pain, nausea and vomiting, started yesterday. EXAM: CT ABDOMEN AND PELVIS WITH CONTRAST TECHNIQUE: Multidetector CT imaging of the abdomen and pelvis was performed using the standard protocol following bolus administration of intravenous contrast. CONTRAST:  147m ISOVUE-300 IOPAMIDOL (ISOVUE-300) INJECTION 61% COMPARISON:  None. FINDINGS: Lower chest: Infiltration in the left lung base, possibly pneumonia. Right cardiophrenic angle cyst, probably a pericardial cyst. Hepatobiliary: No focal liver abnormality is seen. No gallstones, gallbladder wall thickening, or biliary dilatation. Pancreas: Unremarkable. No pancreatic ductal dilatation or surrounding inflammatory changes. Spleen: Normal in size without focal abnormality. Adrenals/Urinary Tract: Adrenal glands are unremarkable. Kidneys are normal, without renal calculi, focal lesion, or hydronephrosis. Bladder is unremarkable. Stomach/Bowel: Stomach, small bowel, and colon are not abnormally distended. Significant appendiceal dilatation with diameter measuring up to about 12 mm. Prominent periappendiceal stranding and edema. Changes are consistent with acute appendicitis. Appendix: Location: Medial to the cecum and extending towards the midline in the posterior pelvis Diameter: 12 mm Appendicolith: No Mucosal hyper-enhancement: Yes Extraluminal gas: No Periappendiceal collection: No loculated collection. Significant stranding and edema. Vascular/Lymphatic: Aortic atherosclerosis. No enlarged abdominal or pelvic lymph nodes. Reproductive: Uterus and bilateral adnexa are unremarkable. Other: No free air or free fluid in the abdomen. Abdominal wall musculature appears intact. Musculoskeletal: No acute or significant osseous findings. IMPRESSION: 1. Acute appendicitis with appendiceal dilatation and periappendiceal stranding. No evidence of perforation or abscess. 2. Infiltration in the left lung base may indicate pneumonia. Aortic Atherosclerosis  (ICD10-I70.0). Electronically Signed   By: WLucienne CapersM.D.   On: 12/29/2017 02:24    Review of Systems  Constitutional: Negative for chills, fever and malaise/fatigue.  HENT: Negative for ear discharge, hearing loss and sore throat.   Eyes: Negative for blurred vision and discharge.  Respiratory: Negative for cough and shortness of breath.   Cardiovascular: Negative for chest pain, orthopnea and leg swelling.  Gastrointestinal: Positive for abdominal pain, diarrhea, nausea and vomiting. Negative for constipation and heartburn.  Musculoskeletal: Negative for myalgias and neck pain.  Skin: Negative for itching and rash.  Neurological: Negative for dizziness, focal weakness, seizures and loss of consciousness.  Endo/Heme/Allergies: Negative for environmental allergies. Does not bruise/bleed easily.  Psychiatric/Behavioral: Negative for depression and suicidal ideas.  All other systems reviewed and are negative.   Blood pressure 130/80, pulse 87, temperature 99.1  F (37.3 C), temperature source Oral, resp. rate 18, SpO2 94 %. Physical Exam  Constitutional: She is oriented to person, place, and time. Vital signs are normal. She appears well-developed and well-nourished.  Conversant No acute distress  Eyes: Lids are normal. No scleral icterus.  No lid lag Moist conjunctiva  Neck: No tracheal tenderness present. No thyromegaly present.  No cervical lymphadenopathy  Cardiovascular: Normal rate, regular rhythm and intact distal pulses.  No murmur heard. Respiratory: Effort normal and breath sounds normal. She has no wheezes. She has no rales.  GI: Soft. There is no hepatosplenomegaly. There is tenderness. There is guarding and tenderness at McBurney's point. There is no rebound. No hernia.  Neurological: She is alert and oriented to person, place, and time.  Normal gait and station  Skin: Skin is warm. No rash noted. No cyanosis. Nails show no clubbing.  Normal skin turgor   Psychiatric: Judgment normal.  Appropriate affect     Assessment/Plan 52 year old female with acute appendicitis  1.  Patient will be admitted and scheduled for urgent lap appendectomy.  N.p.o., antibiotics, consent for lap appendectomy. 2.I discussed with the patient the risks benefits of the procedure to include but not limited to: Infection, bleeding, damage to surrounding structures, possible ileus, possible postoperative infection. Patient voiced understanding and wishes to proceed.   Reyes Ivan, MD 12/29/2017, 2:52 AM

## 2017-12-29 NOTE — Progress Notes (Signed)
Inpatient Diabetes Program Recommendations  AACE/ADA: New Consensus Statement on Inpatient Glycemic Control (2015)  Target Ranges:  Prepandial:   less than 140 mg/dL      Peak postprandial:   less than 180 mg/dL (1-2 hours)      Critically ill patients:  140 - 180 mg/dL  Results for Sydney Nelson, Sydney Nelson (MRN [id number]) as of 12/29/2017 12:56  Ref. Range 12/29/2017 05:45 12/29/2017 10:09  Glucose-Capillary Latest Ref Range: 70 - 99 mg/dL 180 (H) 155 (H)   Results for Sydney Nelson, Sydney Nelson (MRN [id number]) as of 12/29/2017 12:56  Ref. Range 12/28/2017 19:42  Glucose Latest Ref Range: 70 - 99 mg/dL 216 (H)   Review of Glycemic Control  Diabetes history: No Outpatient Diabetes medications: NA Current orders for Inpatient glycemic control: None  Inpatient Diabetes Program Recommendations:  HgbA1C: Please consider ordering an A1C to evaluate glycemic control over the past 2-3 months. Insulin-Correction: While inpatient, may want to order CBGs with Novolog 0-9 units TID with meals and Novolog 0-5 units QHS   NOTE: No DM history noted. Initial glucose 216 mg/dl and fasting glucose 180 mg/dl today. Patient received Decadron 10 mg x 1 today which is likely to contribute to hyperglycemia.   Thanks, Barnie Alderman, RN, MSN, CDE Diabetes Coordinator Inpatient Diabetes Program [phone] (Team Pager from 8am to 5pm)

## 2017-12-29 NOTE — ED Notes (Signed)
Patient transported to CT 

## 2017-12-29 NOTE — Discharge Instructions (Signed)
Pos-operative follow up appointments are no additional charge for an office visit.  Please arrive at least 30 min before your appointment to complete your check in paperwork.  If you are unable to arrive 30 min prior to your appointment time we may have to cancel or reschedule you.  LAPAROSCOPIC SURGERY: POST OP INSTRUCTIONS  1. DIET: Follow a light bland diet the first 24 hours after arrival home, such as soup, liquids, crackers, etc. Be sure to include lots of fluids daily. Avoid fast food or heavy meals as your are more likely to get nauseated. Eat a low fat the next few days after surgery.  2. Take your usually prescribed home medications unless otherwise directed. 3. PAIN CONTROL:  1. Pain is best controlled by a usual combination of three different methods TOGETHER:  1. Ice/Heat 2. Over the counter pain medication 3. Prescription pain medication 2. Most patients will experience some swelling and bruising around the incisions. Ice packs or heating pads (30-60 minutes up to 6 times a day) will help. Use ice for the first few days to help decrease swelling and bruising, then switch to heat to help relax tight/sore spots and speed recovery. Some people prefer to use ice alone, heat alone, alternating between ice & heat. Experiment to what works for you. Swelling and bruising can take several weeks to resolve.  3. It is helpful to take an over-the-counter pain medication regularly for the first few weeks. Choose one of the following that works best for you:  1. Naproxen (Aleve, etc) Two 220mg  tabs twice a day 2. Ibuprofen (Advil, etc) Three 200mg  tabs four times a day (every meal & bedtime) 3. Acetaminophen (Tylenol, etc) 500-650mg  four times a day (every meal & bedtime) 4. A prescription for pain medication (such as oxycodone, hydrocodone, etc) should be given to you upon discharge. Take your pain medication as prescribed.  1. If you are having problems/concerns with the prescription medicine  (does not control pain, nausea, vomiting, rash, itching, etc), please call us [phone] to see if we need to switch you to a different pain medicine that will work better for you and/or control your side effect better. 2. If you need a refill on your pain medication, please contact your pharmacy. They will contact our office to request authorization. Prescriptions will not be filled after 5 pm or on week-ends. 4. Avoid getting constipated. Between the surgery and the pain medications, it is common to experience some constipation. Increasing fluid intake and taking a fiber supplement (such as Metamucil, Citrucel, FiberCon, MiraLax, etc) 1-2 times a day regularly will usually help prevent this problem from occurring. A mild laxative (prune juice, Milk of Magnesia, MiraLax, etc) should be taken according to package directions if there are no bowel movements after 48 hours.  5. Watch out for diarrhea. If you have many loose bowel movements, simplify your diet to bland foods & liquids for a few days. Stop any stool softeners and decrease your fiber supplement. Switching to mild anti-diarrheal medications (Kayopectate, Pepto Bismol) can help. If this worsens or does not improve, please call us. 6. Wash / shower every day. You may shower over the dressings as they are waterproof. Continue to shower over incision(s) after the dressing is off. 7. Remove your waterproof bandages 5 days after surgery. You may leave the incision open to air. You may replace a dressing/Band-Aid to cover the incision for comfort if you wish.  8. ACTIVITIES as tolerated:  1. You may resume regular (  light) daily activities beginning the next day--such as daily self-care, walking, climbing stairs--gradually increasing activities as tolerated. If you can walk 30 minutes without difficulty, it is safe to try more intense activity such as jogging, treadmill, bicycling, low-impact aerobics, swimming, etc. 2. Save the most intensive and  strenuous activity for last such as sit-ups, heavy lifting, contact sports, etc Refrain from any heavy lifting or straining until you are off narcotics for pain control.  3. DO NOT PUSH THROUGH PAIN. Let pain be your guide: If it hurts to do something, don't do it. Pain is your body warning you to avoid that activity for another week until the pain goes down. 4. You may drive when you are no longer taking prescription pain medication, you can comfortably wear a seatbelt, and you can safely maneuver your car and apply brakes. 5. You may have sexual intercourse when it is comfortable.  9. FOLLOW UP in our office  1. Please call [facility] at [phone] to set up an appointment to see your surgeon in the office for a follow-up appointment approximately 2-3 weeks after your surgery. 2. Make sure that you call for this appointment the day you arrive home to insure a convenient appointment time.      10. IF YOU HAVE DISABILITY OR FAMILY LEAVE FORMS, BRING THEM TO THE               OFFICE FOR PROCESSING.   WHEN TO CALL us [phone]:  1. Poor pain control 2. Reactions / problems with new medications (rash/itching, nausea, etc)  3. Fever over 101.5 F (38.5 C) 4. Inability to urinate 5. Nausea and/or vomiting 6. Worsening swelling or bruising 7. Continued bleeding from incision. 8. Increased pain, redness, or drainage from the incision  The clinic staff is available to answer your questions during regular business hours (8:30am-5pm). Please dont hesitate to call and ask to speak to one of our nurses for clinical concerns.  If you have a medical emergency, go to the nearest emergency room or call 911.  A surgeon from [facility] is always on call at the [facility], [place], [place], [place],  [address] ?  MAIN: [phone] ? TOLL FREE: [phone] ?  FAX [phone]  [email or web address]

## 2017-12-30 ENCOUNTER — Encounter (HOSPITAL_COMMUNITY): Payer: Self-pay | Admitting: General Surgery

## 2017-12-30 LAB — GLUCOSE, CAPILLARY: Glucose-Capillary: 133 mg/dL — ABNORMAL HIGH (ref 70–99)

## 2017-12-30 MED ORDER — HYDROCODONE-ACETAMINOPHEN 5-325 MG PO TABS: 1.0000 | ORAL_TABLET | Freq: Four times a day (QID) | ORAL | 0 refills | Status: DC | PRN

## 2017-12-30 NOTE — Progress Notes (Signed)
Discharged home today accompanied by husband. Discharged instructions, Personal belongings given to patient.Verbalized understanding of instructions

## 2017-12-30 NOTE — Discharge Summary (Signed)
[name]   Patient ID: Sydney Nelson MRN: [id number] DOB/AGE: October 09, 1965 52 y.o.  Admit date: 12/28/2017 Discharge date: 12/30/2017  Admitting Diagnosis: Acute appendicitis     Discharge Diagnosis Patient Active Problem List   Diagnosis Date Noted  . Acute appendicitis 12/29/2017    Consultants None  Imaging: Ct Abdomen Pelvis W Contrast  Result Date: 12/29/2017 CLINICAL DATA:  Mid to lower abdominal pain, nausea and vomiting, started yesterday. EXAM: CT ABDOMEN AND PELVIS WITH CONTRAST TECHNIQUE: Multidetector CT imaging of the abdomen and pelvis was performed using the standard protocol following bolus administration of intravenous contrast. CONTRAST:  137mL ISOVUE-300 IOPAMIDOL (ISOVUE-300) INJECTION 61% COMPARISON:  None. FINDINGS: Lower chest: Infiltration in the left lung base, possibly pneumonia. Right cardiophrenic angle cyst, probably a pericardial cyst. Hepatobiliary: No focal liver abnormality is seen. No gallstones, gallbladder wall thickening, or biliary dilatation. Pancreas: Unremarkable. No pancreatic ductal dilatation or surrounding inflammatory changes. Spleen: Normal in size without focal abnormality. Adrenals/Urinary Tract: Adrenal glands are unremarkable. Kidneys are normal, without renal calculi, focal lesion, or hydronephrosis. Bladder is unremarkable. Stomach/Bowel: Stomach, small bowel, and colon are not abnormally distended. Significant appendiceal dilatation with diameter measuring up to about 12 mm. Prominent periappendiceal stranding and edema. Changes are consistent with acute appendicitis. Appendix: Location: Medial to the cecum and extending towards the midline in the posterior pelvis Diameter: 12 mm Appendicolith: No Mucosal hyper-enhancement: Yes Extraluminal gas: No Periappendiceal collection: No loculated collection. Significant stranding and edema. Vascular/Lymphatic: Aortic atherosclerosis. No enlarged abdominal or pelvic  lymph nodes. Reproductive: Uterus and bilateral adnexa are unremarkable. Other: No free air or free fluid in the abdomen. Abdominal wall musculature appears intact. Musculoskeletal: No acute or significant osseous findings. IMPRESSION: 1. Acute appendicitis with appendiceal dilatation and periappendiceal stranding. No evidence of perforation or abscess. 2. Infiltration in the left lung base may indicate pneumonia. Aortic Atherosclerosis (ICD10-I70.0). Electronically Signed   By: Lucienne Capers M.D.   On: 12/29/2017 02:24    Procedures Dr. Marlou Starks (12/29/17) - Laparoscopic Appendectomy  Hospital Course:  Patient is a 52 year old female who presented to [facility] with abdominal pain.  Workup showed acute appendicitis.  Patient was admitted and underwent procedure listed above.  Tolerated procedure well and was transferred to the floor.  Diet was advanced as tolerated.  On POD#1, the patient was voiding well, tolerating diet, ambulating well, pain well controlled, vital signs stable, incisions c/d/i and felt stable for discharge home.  Patient will follow up in our office in 3 weeks and knows to call with questions or concerns. She will call to confirm appointment date/time.    Physical Exam: General:  Alert, NAD, pleasant, comfortable Abd:  Soft, ND, mild tenderness, incisions C/D/I  Allergies as of 12/30/2017      Reactions   Penicillins Anaphylaxis   Shellfish Allergy Anaphylaxis      Medication List    STOP taking these medications   naproxen sodium 220 MG tablet Commonly known as:  ALEVE     TAKE these medications   albuterol 108 (90 Base) MCG/ACT inhaler Commonly known as:  PROVENTIL HFA;VENTOLIN HFA Inhale 1-2 puffs into the lungs every 6 (six) hours as needed for wheezing or shortness of breath.   cetirizine 10 MG tablet Commonly known as:  ZYRTEC Take 10 mg by mouth daily as needed for allergies.   cholecalciferol 1000 units tablet Commonly known as:  VITAMIN D Take 5,000 Units by  mouth daily.   clonazePAM 1 MG tablet Commonly known  as:  KLONOPIN Take 1 mg by mouth at bedtime.   EPINEPHrine 0.3 mg/0.3 mL Soaj injection Commonly known as:  EPI-PEN Inject 0.3 mg into the muscle daily as needed (allergic reaction).   escitalopram 20 MG tablet Commonly known as:  LEXAPRO Take 20 mg by mouth daily.   ezetimibe 10 MG tablet Commonly known as:  ZETIA Take 10 mg by mouth daily.   HYDROcodone-acetaminophen 5-325 MG tablet Commonly known as:  NORCO/VICODIN Take 1-2 tablets by mouth every 6 (six) hours as needed for moderate pain.   ibuprofen 200 MG tablet Commonly known as:  ADVIL,MOTRIN Take 400 mg by mouth every 6 (six) hours as needed for moderate pain.   neomycin-bacitracin-polymyxin Oint Commonly known as:  NEOSPORIN Apply 1 application topically daily as needed for wound care.   ranitidine 150 MG tablet Commonly known as:  ZANTAC Take 150 mg by mouth 2 (two) times daily as needed for heartburn.   valACYclovir 500 MG tablet Commonly known as:  VALTREX Take 1,000 mg by mouth daily as needed (cold sores).        Follow-up Information    Jovita Kussmaul, MD. Go on 01/20/2018.   Specialty:  General Surgery Why:  Follow up appointment scheduled for 11:10 AM. Please arrive 30 min prior to appointment time for check in. Bring photo ID and insurance information.  Contact information: [place] [address] [phone]           Signed: Brigid Nelson, [facility] 12/30/2017, 9:40 AM Pager: [phone] Consults: [phone] Mon-Fri 7:00 am-4:30 pm Sat-Sun 7:00 am-11:30 am

## 2018-02-02 DIAGNOSIS — M542 Cervicalgia: Secondary | ICD-10-CM | POA: Diagnosis not present

## 2018-02-02 DIAGNOSIS — M9903 Segmental and somatic dysfunction of lumbar region: Secondary | ICD-10-CM | POA: Diagnosis not present

## 2018-02-02 DIAGNOSIS — M9901 Segmental and somatic dysfunction of cervical region: Secondary | ICD-10-CM | POA: Diagnosis not present

## 2018-02-02 DIAGNOSIS — M6080 Other myositis, unspecified site: Secondary | ICD-10-CM | POA: Diagnosis not present

## 2018-02-03 DIAGNOSIS — M9901 Segmental and somatic dysfunction of cervical region: Secondary | ICD-10-CM | POA: Diagnosis not present

## 2018-02-03 DIAGNOSIS — M6080 Other myositis, unspecified site: Secondary | ICD-10-CM | POA: Diagnosis not present

## 2018-02-03 DIAGNOSIS — M9903 Segmental and somatic dysfunction of lumbar region: Secondary | ICD-10-CM | POA: Diagnosis not present

## 2018-02-03 DIAGNOSIS — M542 Cervicalgia: Secondary | ICD-10-CM | POA: Diagnosis not present

## 2018-02-28 ENCOUNTER — Other Ambulatory Visit: Payer: Self-pay | Admitting: General Surgery

## 2018-02-28 DIAGNOSIS — N611 Abscess of the breast and nipple: Secondary | ICD-10-CM | POA: Diagnosis not present

## 2018-03-03 NOTE — Pre-Procedure Instructions (Signed)
[facility]  03/03/2018      [id number] [address], [place] [address] Phone: [phone] Fax: [phone]    Your procedure is scheduled on Monday November 11.  Report to [facility] Admitting at 5:30 A.M.  Call this number if you have problems the morning of surgery:  [phone]   Remember:  Do not eat or drink after midnight.  You may drink clear liquids until 4:30AM (3 hours prior to surgery) .  Clear liquids allowed are:  Water, Juice (non-citric and without pulp), Clear Tea, Black Coffee only and Gatorade    Take these medicines the morning of surgery with A SIP OF WATER:   Escitalopram (Lexampro)  Ranitidine (Zantac) Cetirizine (Zyrtec) if needed Albuterol if needed (please bring inhaler to hospital with you)  7 days prior to surgery STOP taking any Aspirin(unless otherwise instructed by your surgeon), Aleve, Naproxen, Ibuprofen, Motrin, Advil, Goody's, BC's, all herbal medications, fish oil, and all vitamins  . Do not take oral diabetes medicines (pills) the morning of surgery.  . THE NIGHT BEFORE SURGERY, take ___________ units of ___________insulin.       . THE MORNING OF SURGERY, take _____________ units of __________insulin.  . The day of surgery, do not take other diabetes injectables, including Byetta (exenatide), Bydureon (exenatide ER), Victoza (liraglutide), or Trulicity (dulaglutide).  . If your CBG is greater than 220 mg/dL, you may take  of your sliding scale (correction) dose of insulin.    How to Manage Your Diabetes Before and After Surgery  Why is it important to control my blood sugar before and after surgery? . Improving blood sugar levels before and after surgery helps healing and can limit problems. . A way of improving blood sugar control is eating a healthy diet by: o  Eating less sugar and carbohydrates o   Increasing activity/exercise o  Talking with your doctor about reaching your blood sugar goals . High blood sugars (greater than 180 mg/dL) can raise your risk of infections and slow your recovery, so you will need to focus on controlling your diabetes during the weeks before surgery. . Make sure that the doctor who takes care of your diabetes knows about your planned surgery including the date and location.  How do I manage my blood sugar before surgery? . Check your blood sugar at least 4 times a day, starting 2 days before surgery, to make sure that the level is not too high or low. o Check your blood sugar the morning of your surgery when you wake up and every 2 hours until you get to the Short Stay unit. . If your blood sugar is less than 70 mg/dL, you will need to treat for low blood sugar: o Do not take insulin. o Treat a low blood sugar (less than 70 mg/dL) with  cup of clear juice (cranberry or apple), 4 glucose tablets, OR glucose gel. Recheck blood sugar in 15 minutes after treatment (to make sure it is greater than 70 mg/dL). If your blood sugar is not greater than 70 mg/dL on recheck, call [phone] o  for further instructions. . Report your blood sugar to the short stay nurse when you get to Short Stay.  . If you are admitted to the hospital after surgery: o Your blood sugar will be checked by the staff and you will probably be given insulin  after surgery (instead of oral diabetes medicines) to make sure you have good blood sugar levels. o The goal for blood sugar control after surgery is 80-180 mg/dL.               Do not wear jewelry, make-up or nail polish.  Do not wear lotions, powders, or perfumes, or deodorant.  Do not shave 48 hours prior to surgery.  Men may shave face and neck.  Do not bring valuables to the hospital.  [facility] is not responsible for any belongings or valuables.  Contacts, dentures or bridgework may not be worn into surgery.  Leave  your suitcase in the car.  After surgery it may be brought to your room.  For patients admitted to the hospital, discharge time will be determined by your treatment team.  Patients discharged the day of surgery will not be allowed to drive home.    Special instructions:    - Preparing For Surgery  Before surgery, you can play an important role. Because skin is not sterile, your skin needs to be as free of germs as possible. You can reduce the number of germs on your skin by washing with CHG (chlorahexidine gluconate) Soap before surgery.  CHG is an antiseptic cleaner which kills germs and bonds with the skin to continue killing germs even after washing.    Oral Hygiene is also important to reduce your risk of infection.  Remember - BRUSH YOUR TEETH THE MORNING OF SURGERY WITH YOUR REGULAR TOOTHPASTE  Please do not use if you have an allergy to CHG or antibacterial soaps. If your skin becomes reddened/irritated stop using the CHG.  Do not shave (including legs and underarms) for at least 48 hours prior to first CHG shower. It is OK to shave your face.  Please follow these instructions carefully.   1. Shower the NIGHT BEFORE SURGERY and the MORNING OF SURGERY with CHG.   2. If you chose to wash your hair, wash your hair first as usual with your normal shampoo.  3. After you shampoo, rinse your hair and body thoroughly to remove the shampoo.  4. Use CHG as you would any other liquid soap. You can apply CHG directly to the skin and wash gently with a scrungie or a clean washcloth.   5. Apply the CHG Soap to your body ONLY FROM THE NECK DOWN.  Do not use on open wounds or open sores. Avoid contact with your eyes, ears, mouth and genitals (private parts). Wash Face and genitals (private parts)  with your normal soap.  6. Wash thoroughly, paying special attention to the area where your surgery will be performed.  7. Thoroughly rinse your body with warm water from the neck  down.  8. DO NOT shower/wash with your normal soap after using and rinsing off the CHG Soap.  9. Pat yourself dry with a CLEAN TOWEL.  10. Wear CLEAN PAJAMAS to bed the night before surgery, wear comfortable clothes the morning of surgery  11. Place CLEAN SHEETS on your bed the night of your first shower and DO NOT SLEEP WITH PETS.    Day of Surgery:  Do not apply any deodorants/lotions.  Please wear clean clothes to the hospital/surgery center.   Remember to brush your teeth WITH YOUR REGULAR TOOTHPASTE.    Please read over the following fact sheets that you were given. Coughing and Deep Breathing and Surgical Site Infection Prevention

## 2018-03-04 ENCOUNTER — Encounter (HOSPITAL_COMMUNITY): Payer: Self-pay | Admitting: Urology

## 2018-03-04 ENCOUNTER — Other Ambulatory Visit: Payer: Self-pay

## 2018-03-04 ENCOUNTER — Encounter (HOSPITAL_COMMUNITY)
Admission: RE | Admit: 2018-03-04 | Discharge: 2018-03-04 | Disposition: A | Discharge status: Home / Self Care | Payer: BLUE CROSS/BLUE SHIELD | Source: Ambulatory Visit | Attending: General Surgery | Admitting: General Surgery

## 2018-03-04 DIAGNOSIS — N611 Abscess of the breast and nipple: Secondary | ICD-10-CM | POA: Insufficient documentation

## 2018-03-04 DIAGNOSIS — R9431 Abnormal electrocardiogram [ECG] [EKG]: Secondary | ICD-10-CM | POA: Insufficient documentation

## 2018-03-04 DIAGNOSIS — E119 Type 2 diabetes mellitus without complications: Secondary | ICD-10-CM | POA: Diagnosis not present

## 2018-03-04 DIAGNOSIS — Z01818 Encounter for other preprocedural examination: Secondary | ICD-10-CM | POA: Insufficient documentation

## 2018-03-04 LAB — GLUCOSE, CAPILLARY: GLUCOSE-CAPILLARY: 181 mg/dL — AB (ref 70–99)

## 2018-03-04 LAB — BASIC METABOLIC PANEL
Anion gap: 9 (ref 5–15)
BUN: 7 mg/dL (ref 6–20)
CALCIUM: 9.1 mg/dL (ref 8.9–10.3)
CHLORIDE: 104 mmol/L (ref 98–111)
CO2: 24 mmol/L (ref 22–32)
CREATININE: 0.75 mg/dL (ref 0.44–1.00)
GFR calc Af Amer: >60 mL/min (ref >60)
GFR calc non Af Amer: >60 mL/min (ref >60)
GLUCOSE: 136 mg/dL — AB (ref 70–99)
Potassium: 3.8 mmol/L (ref 3.5–5.1)
Sodium: 137 mmol/L (ref 135–145)

## 2018-03-04 LAB — CBC
HEMATOCRIT: 48.4 % — AB (ref 36.0–46.0)
Hemoglobin: 15.5 g/dL — ABNORMAL HIGH (ref 12.0–15.0)
MCH: 31 pg (ref 26.0–34.0)
MCHC: 32 g/dL (ref 30.0–36.0)
MCV: 96.8 fL (ref 80.0–100.0)
PLATELETS: 296 10*3/uL (ref 150–400)
RBC: 5 MIL/uL (ref 3.87–5.11)
RDW: 13.5 % (ref 11.5–15.5)
WBC: 12.1 10*3/uL — ABNORMAL HIGH (ref 4.0–10.5)
nRBC: 0 % (ref 0.0–0.2)

## 2018-03-04 LAB — HEMOGLOBIN A1C
HEMOGLOBIN A1C: 6.6 % — AB (ref 4.8–5.6)
Mean Plasma Glucose: 142.72 mg/dL

## 2018-03-04 NOTE — Progress Notes (Signed)
PCP - Jonathon Jordan Cardiologist - denies  EKG - 03/04/2018   Fasting Blood Sugar - typically 100-120s in the AM at home. Pt states she is symptomatic if less than 100. A1c drawn today  Patient denies shortness of breath, fever, cough and chest pain at PAT appointment   Patient verbalized understanding of instructions that were given to them at the PAT appointment. Patient was also instructed that they will need to review over the PAT instructions again at home before surgery.

## 2018-03-06 NOTE — Anesthesia Preprocedure Evaluation (Addendum)
Anesthesia Evaluation  Patient identified by MRN, date of birth, ID band Patient awake    Reviewed: Allergy & Precautions, NPO status , Patient's Chart, lab work & pertinent test results  Airway Mallampati: II  TM Distance: >3 FB Neck ROM: Full    Dental no notable dental hx.    Pulmonary asthma , Current Smoker,    Pulmonary exam normal breath sounds clear to auscultation       Cardiovascular negative cardio ROS Normal cardiovascular exam Rhythm:Regular Rate:Normal  NSR, rate 78   Neuro/Psych  Headaches, Anxiety    GI/Hepatic Neg liver ROS, GERD  Controlled,  Endo/Other  diabetes  Renal/GU negative Renal ROS     Musculoskeletal negative musculoskeletal ROS (+)   Abdominal (+) + obese,   Peds  Hematology HLD   Anesthesia Other Findings Chronic right breast abscess  Reproductive/Obstetrics                            Anesthesia Physical Anesthesia Plan  ASA: III  Anesthesia Plan: General   Post-op Pain Management:    Induction: Intravenous  PONV Risk Score and Plan: 2 and Ondansetron, Dexamethasone, Midazolam and Treatment may vary due to age or medical condition  Airway Management Planned: LMA  Additional Equipment:   Intra-op Plan:   Post-operative Plan: Extubation in OR  Informed Consent: I have reviewed the patients History and Physical, chart, labs and discussed the procedure including the risks, benefits and alternatives for the proposed anesthesia with the patient or authorized representative who has indicated his/her understanding and acceptance.   Dental advisory given  Plan Discussed with: CRNA  Anesthesia Plan Comments:        Anesthesia Quick Evaluation

## 2018-03-07 ENCOUNTER — Ambulatory Visit (HOSPITAL_COMMUNITY): Payer: BLUE CROSS/BLUE SHIELD | Admitting: Anesthesiology

## 2018-03-07 ENCOUNTER — Other Ambulatory Visit: Payer: Self-pay

## 2018-03-07 ENCOUNTER — Encounter (HOSPITAL_COMMUNITY): Payer: Self-pay | Admitting: *Deleted

## 2018-03-07 ENCOUNTER — Encounter (HOSPITAL_COMMUNITY)
Admission: RE | Disposition: A | Discharge status: Home / Self Care | Payer: Self-pay | Source: Ambulatory Visit | Attending: General Surgery

## 2018-03-07 ENCOUNTER — Ambulatory Visit (HOSPITAL_COMMUNITY)
Admission: RE | Admit: 2018-03-07 | Discharge: 2018-03-07 | Disposition: A | Discharge status: Home / Self Care | Payer: BLUE CROSS/BLUE SHIELD | Source: Ambulatory Visit | Attending: General Surgery | Admitting: General Surgery

## 2018-03-07 DIAGNOSIS — F419 Anxiety disorder, unspecified: Secondary | ICD-10-CM | POA: Diagnosis not present

## 2018-03-07 DIAGNOSIS — Z883 Allergy status to other anti-infective agents status: Secondary | ICD-10-CM | POA: Insufficient documentation

## 2018-03-07 DIAGNOSIS — J45909 Unspecified asthma, uncomplicated: Secondary | ICD-10-CM | POA: Insufficient documentation

## 2018-03-07 DIAGNOSIS — Z85828 Personal history of other malignant neoplasm of skin: Secondary | ICD-10-CM | POA: Insufficient documentation

## 2018-03-07 DIAGNOSIS — Z79899 Other long term (current) drug therapy: Secondary | ICD-10-CM | POA: Insufficient documentation

## 2018-03-07 DIAGNOSIS — N611 Abscess of the breast and nipple: Secondary | ICD-10-CM | POA: Insufficient documentation

## 2018-03-07 DIAGNOSIS — N6310 Unspecified lump in the right breast, unspecified quadrant: Secondary | ICD-10-CM | POA: Insufficient documentation

## 2018-03-07 DIAGNOSIS — K219 Gastro-esophageal reflux disease without esophagitis: Secondary | ICD-10-CM | POA: Diagnosis not present

## 2018-03-07 DIAGNOSIS — F17210 Nicotine dependence, cigarettes, uncomplicated: Secondary | ICD-10-CM | POA: Diagnosis not present

## 2018-03-07 DIAGNOSIS — E7800 Pure hypercholesterolemia, unspecified: Secondary | ICD-10-CM | POA: Diagnosis not present

## 2018-03-07 DIAGNOSIS — E119 Type 2 diabetes mellitus without complications: Secondary | ICD-10-CM | POA: Insufficient documentation

## 2018-03-07 HISTORY — PX: EXCISION OF BREAST LESION: SHX6676

## 2018-03-07 HISTORY — PX: BREAST EXCISIONAL BIOPSY: SUR124

## 2018-03-07 LAB — GLUCOSE, CAPILLARY
Glucose-Capillary: 155 mg/dL — ABNORMAL HIGH (ref 70–99)
Glucose-Capillary: 154 mg/dL — ABNORMAL HIGH (ref 70–99)

## 2018-03-07 SURGERY — EXCISION OF BREAST LESION
Anesthesia: General | Site: Breast | Laterality: Right

## 2018-03-07 MED ORDER — CIPROFLOXACIN IN D5W 400 MG/200ML IV SOLN
400.0000 mg | INTRAVENOUS | Status: AC
  Administered 2018-03-07: 400 mg via INTRAVENOUS
  Filled 2018-03-07: qty 200

## 2018-03-07 MED ORDER — OXYCODONE HCL 5 MG PO TABS: 5.0000 mg | ORAL_TABLET | Freq: Four times a day (QID) | ORAL | 0 refills | Status: DC | PRN

## 2018-03-07 MED ORDER — HYDROMORPHONE HCL 1 MG/ML IJ SOLN
INTRAMUSCULAR | Status: AC
  Filled 2018-03-07: qty 1

## 2018-03-07 MED ORDER — CELECOXIB 200 MG PO CAPS
200.0000 mg | ORAL_CAPSULE | ORAL | Status: AC
  Administered 2018-03-07: 200 mg via ORAL
  Filled 2018-03-07: qty 1

## 2018-03-07 MED ORDER — ACETAMINOPHEN 500 MG PO TABS
1000.0000 mg | ORAL_TABLET | ORAL | Status: AC
  Administered 2018-03-07: 1000 mg via ORAL
  Filled 2018-03-07: qty 2

## 2018-03-07 MED ORDER — 0.9 % SODIUM CHLORIDE (POUR BTL) OPTIME
TOPICAL | Status: DC | PRN
  Administered 2018-03-07: 1000 mL

## 2018-03-07 MED ORDER — GABAPENTIN 300 MG PO CAPS
300.0000 mg | ORAL_CAPSULE | ORAL | Status: AC
  Administered 2018-03-07: 300 mg via ORAL
  Filled 2018-03-07: qty 1

## 2018-03-07 MED ORDER — OXYCODONE HCL 5 MG PO TABS
ORAL_TABLET | ORAL | Status: AC
  Filled 2018-03-07: qty 1

## 2018-03-07 MED ORDER — ONDANSETRON HCL 4 MG/2ML IJ SOLN
INTRAMUSCULAR | Status: AC
  Filled 2018-03-07: qty 2

## 2018-03-07 MED ORDER — MIDAZOLAM HCL 2 MG/2ML IJ SOLN
INTRAMUSCULAR | Status: DC | PRN
  Administered 2018-03-07: 2 mg via INTRAVENOUS

## 2018-03-07 MED ORDER — ONDANSETRON HCL 4 MG/2ML IJ SOLN
INTRAMUSCULAR | Status: DC | PRN
  Administered 2018-03-07: 4 mg via INTRAVENOUS

## 2018-03-07 MED ORDER — MIDAZOLAM HCL 2 MG/2ML IJ SOLN
INTRAMUSCULAR | Status: AC
  Filled 2018-03-07: qty 2

## 2018-03-07 MED ORDER — LIDOCAINE 2% (20 MG/ML) 5 ML SYRINGE
INTRAMUSCULAR | Status: DC | PRN
  Administered 2018-03-07: 80 mg via INTRAVENOUS

## 2018-03-07 MED ORDER — OXYCODONE HCL 5 MG/5ML PO SOLN: 5.0000 mg | Freq: Once | ORAL | Status: AC | PRN

## 2018-03-07 MED ORDER — CHLORHEXIDINE GLUCONATE CLOTH 2 % EX PADS: 6.0000 | MEDICATED_PAD | Freq: Once | CUTANEOUS | Status: DC

## 2018-03-07 MED ORDER — LACTATED RINGERS IV SOLN
INTRAVENOUS | Status: DC | PRN
  Administered 2018-03-07: 07:00:00 via INTRAVENOUS

## 2018-03-07 MED ORDER — PROPOFOL 10 MG/ML IV BOLUS
INTRAVENOUS | Status: DC | PRN
  Administered 2018-03-07: 150 mg via INTRAVENOUS

## 2018-03-07 MED ORDER — FENTANYL CITRATE (PF) 250 MCG/5ML IJ SOLN
INTRAMUSCULAR | Status: AC
  Filled 2018-03-07: qty 5

## 2018-03-07 MED ORDER — LIDOCAINE HCL 1 % IJ SOLN
INTRAMUSCULAR | Status: DC | PRN
  Administered 2018-03-07: 30 mL

## 2018-03-07 MED ORDER — BUPIVACAINE-EPINEPHRINE (PF) 0.25% -1:200000 IJ SOLN
INTRAMUSCULAR | Status: AC
  Filled 2018-03-07: qty 30

## 2018-03-07 MED ORDER — DEXAMETHASONE SODIUM PHOSPHATE 10 MG/ML IJ SOLN
INTRAMUSCULAR | Status: DC | PRN
  Administered 2018-03-07: 5 mg via INTRAVENOUS

## 2018-03-07 MED ORDER — DEXAMETHASONE SODIUM PHOSPHATE 10 MG/ML IJ SOLN
INTRAMUSCULAR | Status: AC
  Filled 2018-03-07: qty 1

## 2018-03-07 MED ORDER — HYDROMORPHONE HCL 1 MG/ML IJ SOLN
0.2500 mg | INTRAMUSCULAR | Status: DC | PRN
  Administered 2018-03-07 (×3): 0.5 mg via INTRAVENOUS

## 2018-03-07 MED ORDER — FENTANYL CITRATE (PF) 100 MCG/2ML IJ SOLN
INTRAMUSCULAR | Status: DC | PRN
  Administered 2018-03-07 (×2): 25 ug via INTRAVENOUS
  Administered 2018-03-07: 50 ug via INTRAVENOUS
  Administered 2018-03-07: 25 ug via INTRAVENOUS

## 2018-03-07 MED ORDER — LIDOCAINE 2% (20 MG/ML) 5 ML SYRINGE
INTRAMUSCULAR | Status: AC
  Filled 2018-03-07: qty 5

## 2018-03-07 MED ORDER — PROPOFOL 10 MG/ML IV BOLUS
INTRAVENOUS | Status: AC
  Filled 2018-03-07: qty 40

## 2018-03-07 MED ORDER — OXYCODONE HCL 5 MG PO TABS
5.0000 mg | ORAL_TABLET | Freq: Once | ORAL | Status: AC | PRN
  Administered 2018-03-07: 5 mg via ORAL

## 2018-03-07 MED ORDER — LIDOCAINE HCL (PF) 1 % IJ SOLN
INTRAMUSCULAR | Status: AC
  Filled 2018-03-07: qty 30

## 2018-03-07 SURGICAL SUPPLY — 43 items
BINDER BREAST LRG (GAUZE/BANDAGES/DRESSINGS) IMPLANT
BINDER BREAST XLRG (GAUZE/BANDAGES/DRESSINGS) IMPLANT
BLADE SURG 10 STRL SS (BLADE) ×2 IMPLANT
CANISTER SUCT 3000ML PPV (MISCELLANEOUS) ×2 IMPLANT
CHLORAPREP W/TINT 26ML (MISCELLANEOUS) ×2 IMPLANT
CLIP VESOCCLUDE LG 6/CT (CLIP) ×2 IMPLANT
CONT SPEC 4OZ CLIKSEAL STRL BL (MISCELLANEOUS) ×2 IMPLANT
COVER SURGICAL LIGHT HANDLE (MISCELLANEOUS) ×2 IMPLANT
COVER WAND RF STERILE (DRAPES) IMPLANT
DRAPE CHEST BREAST 15X10 FENES (DRAPES) ×2 IMPLANT
DRAPE UTILITY XL STRL (DRAPES) ×4 IMPLANT
DRSG PAD ABDOMINAL 8X10 ST (GAUZE/BANDAGES/DRESSINGS) ×2 IMPLANT
ELECT CAUTERY BLADE 6.4 (BLADE) ×2 IMPLANT
ELECT REM PT RETURN 9FT ADLT (ELECTROSURGICAL) ×2
ELECTRODE REM PT RTRN 9FT ADLT (ELECTROSURGICAL) ×1 IMPLANT
GAUZE SPONGE 4X4 12PLY STRL (GAUZE/BANDAGES/DRESSINGS) ×2 IMPLANT
GLOVE BIO SURGEON STRL SZ 6 (GLOVE) ×2 IMPLANT
GLOVE INDICATOR 6.5 STRL GRN (GLOVE) ×2 IMPLANT
GOWN STRL REUS W/ TWL LRG LVL3 (GOWN DISPOSABLE) ×1 IMPLANT
GOWN STRL REUS W/TWL 2XL LVL3 (GOWN DISPOSABLE) ×2 IMPLANT
GOWN STRL REUS W/TWL LRG LVL3 (GOWN DISPOSABLE) ×1
ILLUMINATOR WAVEGUIDE N/F (MISCELLANEOUS) IMPLANT
KIT BASIN OR (CUSTOM PROCEDURE TRAY) ×2 IMPLANT
KIT TURNOVER KIT B (KITS) ×2 IMPLANT
LIGHT WAVEGUIDE WIDE FLAT (MISCELLANEOUS) IMPLANT
NEEDLE HYPO 25GX1X1/2 BEV (NEEDLE) ×2 IMPLANT
NS IRRIG 1000ML POUR BTL (IV SOLUTION) ×2 IMPLANT
PACK SURGICAL SETUP 50X90 (CUSTOM PROCEDURE TRAY) ×2 IMPLANT
PAD ARMBOARD 7.5X6 YLW CONV (MISCELLANEOUS) ×2 IMPLANT
PENCIL BUTTON HOLSTER BLD 10FT (ELECTRODE) IMPLANT
PENCIL SMOKE EVAC W/HOLSTER (ELECTROSURGICAL) ×2 IMPLANT
STRIP CLOSURE SKIN 1/2X4 (GAUZE/BANDAGES/DRESSINGS) ×2 IMPLANT
SUT MNCRL AB 4-0 PS2 18 (SUTURE) ×2 IMPLANT
SUT SILK 2 0 PERMA HAND 18 BK (SUTURE) ×2 IMPLANT
SUT VIC AB 3-0 SH 27 (SUTURE) ×2
SUT VIC AB 3-0 SH 27X BRD (SUTURE) ×2 IMPLANT
SWAB COLLECTION DEVICE MRSA (MISCELLANEOUS) ×2 IMPLANT
SWAB CULTURE ESWAB REG 1ML (MISCELLANEOUS) ×2 IMPLANT
SYR CONTROL 10ML LL (SYRINGE) ×2 IMPLANT
TOWEL OR 17X24 6PK STRL BLUE (TOWEL DISPOSABLE) ×2 IMPLANT
TOWEL OR 17X26 10 PK STRL BLUE (TOWEL DISPOSABLE) ×2 IMPLANT
TUBE CONNECTING 12X1/4 (SUCTIONS) ×2 IMPLANT
YANKAUER SUCT BULB TIP NO VENT (SUCTIONS) ×2 IMPLANT

## 2018-03-07 NOTE — Transfer of Care (Signed)
Immediate Anesthesia Transfer of Care Note  Patient: Sydney Nelson  Procedure(s) Performed: EXCISION OF CHRONIC RIGHT BREAST ABSCESS (Right Breast)  Patient Location: PACU  Anesthesia Type:General  Level of Consciousness: awake, alert  and oriented  Airway & Oxygen Therapy: Patient Spontanous Breathing and Patient connected to face mask oxygen  Post-op Assessment: Report given to RN and Post -op Vital signs reviewed and stable  Post vital signs: Reviewed and stable  Last Vitals:  Vitals Value Taken Time  BP 106/70 03/07/2018  8:58 AM  Temp    Pulse 102 03/07/2018  8:58 AM  Resp 18 03/07/2018  8:58 AM  SpO2 96 % 03/07/2018  8:58 AM  Vitals shown include unvalidated device data.  Last Pain:  Vitals:   03/07/18 5697  TempSrc: Oral  PainSc:       Patients Stated Pain Goal: 3 (94/80/16 5537)  Complications: No apparent anesthesia complications

## 2018-03-07 NOTE — Discharge Instructions (Signed)
[facility] °Office Phone Number [phone] ° °BREAST BIOPSY/ PARTIAL MASTECTOMY: POST OP INSTRUCTIONS ° °Always review your discharge instruction sheet given to you by the facility where your surgery was performed. ° °IF YOU HAVE DISABILITY OR FAMILY LEAVE FORMS, YOU MUST BRING THEM TO THE OFFICE FOR PROCESSING.  DO NOT GIVE THEM TO YOUR DOCTOR. ° °1. A prescription for pain medication may be given to you upon discharge.  Take your pain medication as prescribed, if needed.  If narcotic pain medicine is not needed, then you may take acetaminophen (Tylenol) or ibuprofen (Advil) as needed. °2. Take your usually prescribed medications unless otherwise directed °3. If you need a refill on your pain medication, please contact your pharmacy.  They will contact our office to request authorization.  Prescriptions will not be filled after 5pm or on week-ends. °4. You should eat very light the first 24 hours after surgery, such as soup, crackers, pudding, etc.  Resume your normal diet the day after surgery. °5. Most patients will experience some swelling and bruising in the breast.  Ice packs and a good support bra will help.  Swelling and bruising can take several days to resolve.  °6. It is common to experience some constipation if taking pain medication after surgery.  Increasing fluid intake and taking a stool softener will usually help or prevent this problem from occurring.  A mild laxative (Milk of Magnesia or Miralax) should be taken according to package directions if there are no bowel movements after 48 hours. °7. Unless discharge instructions indicate otherwise, you may remove your bandages 48 hours after surgery, and you may shower at that time.  You may have steri-strips (small skin tapes) in place directly over the incision.  These strips should be left on the skin for 7-10 days.   Any sutures or staples will be removed at the office during your follow-up visit. °8. ACTIVITIES:  You may resume  regular daily activities (gradually increasing) beginning the next day.  Wearing a good support bra or sports bra (or the breast binder) minimizes pain and swelling.  You may have sexual intercourse when it is comfortable. °a. You may drive when you no longer are taking prescription pain medication, you can comfortably wear a seatbelt, and you can safely maneuver your car and apply brakes. °b. RETURN TO WORK:  __________1 week_______________ °9. You should see your doctor in the office for a follow-up appointment approximately two weeks after your surgery.  Your doctor’s nurse will typically make your follow-up appointment when she calls you with your pathology report.  Expect your pathology report 2-3 business days after your surgery.  You may call to check if you do not hear from us after three days. ° ° °WHEN TO CALL YOUR DOCTOR: °1. Fever over 101.0 °2. Nausea and/or vomiting. °3. Extreme swelling or bruising. °4. Continued bleeding from incision. °5. Increased pain, redness, or drainage from the incision. ° °The clinic staff is available to answer your questions during regular business hours.  Please don’t hesitate to call and ask to speak to one of the nurses for clinical concerns.  If you have a medical emergency, go to the nearest emergency room or call 911.  A surgeon from [facility] is always on call at the hospital. ° °For further questions, please visit [email or web address]  ° °

## 2018-03-07 NOTE — Anesthesia Postprocedure Evaluation (Signed)
Anesthesia Post Note  Patient: Sydney Nelson  Procedure(s) Performed: EXCISION OF CHRONIC RIGHT BREAST ABSCESS (Right Breast)     Patient location during evaluation: PACU Anesthesia Type: General Level of consciousness: awake and alert Pain management: pain level controlled Vital Signs Assessment: post-procedure vital signs reviewed and stable Respiratory status: spontaneous breathing, nonlabored ventilation, respiratory function stable and patient connected to nasal cannula oxygen Cardiovascular status: blood pressure returned to baseline and stable Postop Assessment: no apparent nausea or vomiting Anesthetic complications: no    Last Vitals:  Vitals:   03/07/18 0915 03/07/18 0945  BP: 138/84 127/70  Pulse: 83 75  Resp:  10  Temp:    SpO2: 93% 92%    Last Pain:  Vitals:   03/07/18 0915  TempSrc:   PainSc: Asleep                 Ryan P Ellender

## 2018-03-07 NOTE — Op Note (Signed)
PRE-OPERATIVE DIAGNOSIS: chronic right breast abscess  POST-OPERATIVE DIAGNOSIS:  Same  PROCEDURE:  Procedure(s): Excision of right breast abscess  SURGEON:  Surgeon(s): Stark Klein, MD  ANESTHESIA:   local and general  DRAINS: none   LOCAL MEDICATIONS USED:  BUPIVICAINE  and LIDOCAINE   SPECIMEN:  Source of Specimen:  culture and chronic right breast abscess  DISPOSITION OF SPECIMEN:  PATHOLOGY  COUNTS:  YES  DICTATION: .Dragon Dictation  PLAN OF CARE: Discharge to home after PACU  PATIENT DISPOSITION:  PACU - hemodynamically stable.  FINDINGS:  Small amount of purulent material behind nipple.  Firm indurated tissue with sinus tract at 12 o'clock and 9 o'clock.    EBL: min  PROCEDURE:  Pt was identified in the holding area and was taken to the OR where she was placed supine on the OR table.  General anesthesia was induced.  The right breast was prepped and draped in sterile fashion.  Timeout was performed according to the surgical safety checklist.  When all was correct, we continued.   The lateral area of chronic inflammation of the skin was excised.  There was some purulent drainage medially.  This was cultured.  The superior portion of the nipple had a sinus tract as well.  This was also excised in an elliptical fashion.  The underlying tissue was very firm with small microabscesses.  This was all excised back to soft breast tissue.    The wound was irrigated copiously. Hemostasis was achieved with cautery.  Local anesthetic was administered into the adjacent skin.  The skin was then reapproximated with 3-0 vicryl interrupted deep dermal sutures and 4-0 monocryl running subcuticular sutures.    The wound was then cleaned, dried, and dressed with steristrips, gauze, ABDs, breast binder.  The patient was allowed to emerge from anesthesia and was taken to the PACU in stable condition.

## 2018-03-07 NOTE — Anesthesia Procedure Notes (Signed)
Procedure Name: LMA Insertion Date/Time: 03/07/2018 7:49 AM Performed by: Trinna Post., CRNA Pre-anesthesia Checklist: Patient identified, Emergency Drugs available, Suction available, Patient being monitored and Timeout performed Patient Re-evaluated:Patient Re-evaluated prior to induction Oxygen Delivery Method: Circle system utilized Preoxygenation: Pre-oxygenation with 100% oxygen Induction Type: IV induction LMA: LMA inserted LMA Size: 4.0 Number of attempts: 1 Placement Confirmation: positive ETCO2 and breath sounds checked- equal and bilateral Tube secured with: Tape Dental Injury: Teeth and Oropharynx as per pre-operative assessment

## 2018-03-07 NOTE — H&P (Signed)
Sydney Nelson is an 52 y.o. female.   Chief Complaint: chronic right breast abscess HPI: Pt is a 52 yo F who continues to have swelling and drainage of chronic right breast abscess.  She has had aspiration multiple times.  She has been on prolonged courses of antibiotics.    Past Medical History:  Diagnosis Date  . Anxiety   . Asthma   . Basal cell carcinoma    "cut off right lip/nose area" (12/29/2017)  . Breast discharge    clear to milky/ one episode bloody  . Breast injury 06/25/2016  . Family history of adverse reaction to anesthesia    "think my older sister had problems w/nausea & vomiting" (12/29/2017)  . GERD (gastroesophageal reflux disease)   . High cholesterol   . Migraine    "a few/year" (12/29/2017)  . Type II diabetes mellitus ([place])     Past Surgical History:  Procedure Laterality Date  . APPENDECTOMY  12/29/2017  . LAPAROSCOPIC APPENDECTOMY N/A 12/29/2017   Procedure: APPENDECTOMY LAPAROSCOPIC;  Surgeon: Jovita Kussmaul, MD;  Location: [facility] [place] [facility];  Service: General;  Laterality: N/A;    History reviewed. No pertinent family history. Social History:  reports that she has been smoking cigarettes. She has a 30.00 pack-year smoking history. She has never used smokeless tobacco. She reports that she drank alcohol. She reports that she does not use drugs.  Allergies:  Allergies  Allergen Reactions  . Penicillins Anaphylaxis    Has patient had a PCN reaction causing immediate rash, facial/tongue/throat swelling, SOB or lightheadedness with hypotension: Yes Has patient had a PCN reaction causing severe rash involving mucus membranes or skin necrosis: No Has patient had a PCN reaction that required hospitalization: No Has patient had a PCN reaction occurring within the last 10 years: No If all of the above answers are "NO", then may proceed with Cephalosporin use.   . Shellfish Allergy Anaphylaxis  . Almond (Diagnostic) Itching  . Monistat [Miconazole]     itching  . Other      Bell peppers - stomach pain / GI bleeding   . Latex Itching and Rash    Medications Prior to Admission  Medication Sig Dispense Refill  . cetirizine (ZYRTEC) 10 MG tablet Take 10 mg by mouth daily as needed for allergies.    . Cholecalciferol (VITAMIN D3) 5000 units CAPS Take 5,000 Units by mouth daily.    . clonazePAM (KLONOPIN) 1 MG tablet Take 1 mg by mouth at bedtime.    . diphenhydrAMINE (BENADRYL) 12.5 MG/5ML liquid Take 12.5 mg by mouth daily as needed for allergies.    Marland Kitchen EPINEPHrine 0.3 mg/0.3 mL IJ SOAJ injection Inject 0.3 mg into the muscle daily as needed (allergic reaction).    Marland Kitchen escitalopram (LEXAPRO) 20 MG tablet Take 20 mg by mouth daily.    Marland Kitchen ezetimibe (ZETIA) 10 MG tablet Take 10 mg by mouth daily.    Marland Kitchen ibuprofen (ADVIL,MOTRIN) 200 MG tablet Take 400 mg by mouth every 6 (six) hours as needed for moderate pain.    Marland Kitchen ketotifen (ALAWAY) 0.025 % ophthalmic solution Place 1 drop into both eyes as needed (allergies).    . mupirocin ointment (BACTROBAN) 2 % Apply 1 application topically as needed (wound care).    . neomycin-bacitracin-polymyxin (NEOSPORIN) OINT Apply 1 application topically daily as needed for wound care.    . ranitidine (ZANTAC) 75 MG tablet Take 75 mg by mouth daily as needed for heartburn.    . valACYclovir (VALTREX) 1000 MG  tablet Take 1,000 mg by mouth every 12 (twelve) hours as needed (fever blisters).    Marland Kitchen albuterol (PROVENTIL HFA;VENTOLIN HFA) 108 (90 Base) MCG/ACT inhaler Inhale 1-2 puffs into the lungs every 6 (six) hours as needed for wheezing or shortness of breath.    Marland Kitchen HYDROcodone-acetaminophen (NORCO/VICODIN) 5-325 MG tablet Take 1-2 tablets by mouth every 6 (six) hours as needed for moderate pain. (Patient not taking: Reported on 02/28/2018) 15 tablet 0  . triamcinolone cream (KENALOG) 0.1 % Apply 1 application topically 2 (two) times daily.      Results for orders placed or performed during the hospital encounter of 03/07/18 (from the past 48  hour(s))  Glucose, capillary     Status: Abnormal   Collection Time: 03/07/18  6:36 AM  Result Value Ref Range   Glucose-Capillary 155 (H) 70 - 99 mg/dL   No results found.  Review of Systems  All other systems reviewed and are negative.   Blood pressure (!) 145/88, pulse 64, temperature 98 F (36.7 C), temperature source Oral, resp. rate 20, height 5' 4.5" (1.638 m), weight 87.5 kg, SpO2 96 %. Physical Exam  Constitutional: She is oriented to person, place, and time. She appears well-developed and well-nourished. No distress.  HENT:  Head: Normocephalic and atraumatic.  Eyes: Pupils are equal, round, and reactive to light. Conjunctivae are normal. No scleral icterus.  Neck: Neck supple.  Cardiovascular: Normal rate.  Respiratory: Effort normal.  GI: Soft.  Musculoskeletal: Normal range of motion.  Neurological: She is alert and oriented to person, place, and time.  Skin: Skin is warm and dry. She is not diaphoretic.  Psychiatric: She has a normal mood and affect. Her behavior is normal. Judgment and thought content normal.     Assessment/Plan Chronic right breast abscess.  Plan excision.  Discussed risk of post op infection and wound breakdown.  Stark Klein, MD 03/07/2018, 7:30 AM

## 2018-03-08 ENCOUNTER — Encounter (HOSPITAL_COMMUNITY): Payer: Self-pay | Admitting: General Surgery

## 2018-03-09 NOTE — Progress Notes (Signed)
Please let patient know pathology is benign.  Culture is benign.

## 2018-03-12 LAB — AEROBIC/ANAEROBIC CULTURE W GRAM STAIN (SURGICAL/DEEP WOUND)

## 2018-03-12 LAB — AEROBIC/ANAEROBIC CULTURE (SURGICAL/DEEP WOUND)

## 2018-05-04 DIAGNOSIS — M542 Cervicalgia: Secondary | ICD-10-CM | POA: Diagnosis not present

## 2018-05-04 DIAGNOSIS — M9903 Segmental and somatic dysfunction of lumbar region: Secondary | ICD-10-CM | POA: Diagnosis not present

## 2018-05-04 DIAGNOSIS — M9901 Segmental and somatic dysfunction of cervical region: Secondary | ICD-10-CM | POA: Diagnosis not present

## 2018-05-04 DIAGNOSIS — M6080 Other myositis, unspecified site: Secondary | ICD-10-CM | POA: Diagnosis not present

## 2018-05-05 DIAGNOSIS — M542 Cervicalgia: Secondary | ICD-10-CM | POA: Diagnosis not present

## 2018-05-05 DIAGNOSIS — M6080 Other myositis, unspecified site: Secondary | ICD-10-CM | POA: Diagnosis not present

## 2018-05-05 DIAGNOSIS — M9903 Segmental and somatic dysfunction of lumbar region: Secondary | ICD-10-CM | POA: Diagnosis not present

## 2018-05-05 DIAGNOSIS — M9901 Segmental and somatic dysfunction of cervical region: Secondary | ICD-10-CM | POA: Diagnosis not present

## 2018-05-05 DIAGNOSIS — E1122 Type 2 diabetes mellitus with diabetic chronic kidney disease: Secondary | ICD-10-CM | POA: Diagnosis not present

## 2018-05-05 DIAGNOSIS — E782 Mixed hyperlipidemia: Secondary | ICD-10-CM | POA: Diagnosis not present

## 2018-05-05 DIAGNOSIS — Z0000 Encounter for general adult medical examination without abnormal findings: Secondary | ICD-10-CM | POA: Diagnosis not present

## 2018-05-05 DIAGNOSIS — Z79899 Other long term (current) drug therapy: Secondary | ICD-10-CM | POA: Diagnosis not present

## 2018-05-05 DIAGNOSIS — E559 Vitamin D deficiency, unspecified: Secondary | ICD-10-CM | POA: Diagnosis not present

## 2018-05-09 ENCOUNTER — Other Ambulatory Visit: Payer: Self-pay | Admitting: Student

## 2018-05-09 DIAGNOSIS — N611 Abscess of the breast and nipple: Secondary | ICD-10-CM

## 2018-05-12 ENCOUNTER — Other Ambulatory Visit (HOSPITAL_COMMUNITY)
Admission: RE | Admit: 2018-05-12 | Discharge: 2018-05-12 | Disposition: A | Discharge status: Home / Self Care | Payer: BLUE CROSS/BLUE SHIELD | Source: Ambulatory Visit | Attending: Obstetrics and Gynecology | Admitting: Obstetrics and Gynecology

## 2018-05-12 ENCOUNTER — Ambulatory Visit
Admission: RE | Admit: 2018-05-12 | Discharge: 2018-05-12 | Disposition: A | Discharge status: Home / Self Care | Payer: BLUE CROSS/BLUE SHIELD | Source: Ambulatory Visit | Attending: Student | Admitting: Student

## 2018-05-12 ENCOUNTER — Other Ambulatory Visit: Payer: Self-pay | Admitting: Obstetrics and Gynecology

## 2018-05-12 DIAGNOSIS — N611 Abscess of the breast and nipple: Secondary | ICD-10-CM

## 2018-05-12 DIAGNOSIS — Z124 Encounter for screening for malignant neoplasm of cervix: Secondary | ICD-10-CM | POA: Insufficient documentation

## 2018-05-12 DIAGNOSIS — N6320 Unspecified lump in the left breast, unspecified quadrant: Secondary | ICD-10-CM | POA: Diagnosis not present

## 2018-05-12 DIAGNOSIS — R928 Other abnormal and inconclusive findings on diagnostic imaging of breast: Secondary | ICD-10-CM | POA: Diagnosis not present

## 2018-05-12 DIAGNOSIS — Z01419 Encounter for gynecological examination (general) (routine) without abnormal findings: Secondary | ICD-10-CM | POA: Diagnosis not present

## 2018-05-16 LAB — CYTOLOGY - PAP
Diagnosis: NEGATIVE
HPV 16/18/45 genotyping: NEGATIVE
HPV: DETECTED — AB

## 2018-06-01 DIAGNOSIS — M9903 Segmental and somatic dysfunction of lumbar region: Secondary | ICD-10-CM | POA: Diagnosis not present

## 2018-06-01 DIAGNOSIS — M9901 Segmental and somatic dysfunction of cervical region: Secondary | ICD-10-CM | POA: Diagnosis not present

## 2018-06-01 DIAGNOSIS — M6080 Other myositis, unspecified site: Secondary | ICD-10-CM | POA: Diagnosis not present

## 2018-06-01 DIAGNOSIS — M542 Cervicalgia: Secondary | ICD-10-CM | POA: Diagnosis not present

## 2018-06-07 ENCOUNTER — Other Ambulatory Visit: Payer: Self-pay | Admitting: General Surgery

## 2018-06-07 DIAGNOSIS — N611 Abscess of the breast and nipple: Secondary | ICD-10-CM

## 2018-06-16 ENCOUNTER — Ambulatory Visit
Admission: RE | Admit: 2018-06-16 | Discharge: 2018-06-16 | Disposition: A | Discharge status: Home / Self Care | Payer: BLUE CROSS/BLUE SHIELD | Source: Ambulatory Visit | Attending: General Surgery | Admitting: General Surgery

## 2018-06-16 DIAGNOSIS — N611 Abscess of the breast and nipple: Secondary | ICD-10-CM | POA: Diagnosis not present

## 2018-08-15 DIAGNOSIS — E1122 Type 2 diabetes mellitus with diabetic chronic kidney disease: Secondary | ICD-10-CM | POA: Diagnosis not present

## 2018-08-15 DIAGNOSIS — N182 Chronic kidney disease, stage 2 (mild): Secondary | ICD-10-CM | POA: Diagnosis not present

## 2018-08-15 DIAGNOSIS — F419 Anxiety disorder, unspecified: Secondary | ICD-10-CM | POA: Diagnosis not present

## 2018-08-31 DIAGNOSIS — M6080 Other myositis, unspecified site: Secondary | ICD-10-CM | POA: Diagnosis not present

## 2018-08-31 DIAGNOSIS — M9903 Segmental and somatic dysfunction of lumbar region: Secondary | ICD-10-CM | POA: Diagnosis not present

## 2018-08-31 DIAGNOSIS — M9901 Segmental and somatic dysfunction of cervical region: Secondary | ICD-10-CM | POA: Diagnosis not present

## 2018-08-31 DIAGNOSIS — M542 Cervicalgia: Secondary | ICD-10-CM | POA: Diagnosis not present

## 2018-09-01 DIAGNOSIS — M9901 Segmental and somatic dysfunction of cervical region: Secondary | ICD-10-CM | POA: Diagnosis not present

## 2018-09-01 DIAGNOSIS — M9903 Segmental and somatic dysfunction of lumbar region: Secondary | ICD-10-CM | POA: Diagnosis not present

## 2018-09-01 DIAGNOSIS — M542 Cervicalgia: Secondary | ICD-10-CM | POA: Diagnosis not present

## 2018-09-01 DIAGNOSIS — M6080 Other myositis, unspecified site: Secondary | ICD-10-CM | POA: Diagnosis not present

## 2018-09-05 DIAGNOSIS — N611 Abscess of the breast and nipple: Secondary | ICD-10-CM | POA: Diagnosis not present

## 2018-09-12 DIAGNOSIS — M6080 Other myositis, unspecified site: Secondary | ICD-10-CM | POA: Diagnosis not present

## 2018-09-12 DIAGNOSIS — M542 Cervicalgia: Secondary | ICD-10-CM | POA: Diagnosis not present

## 2018-09-12 DIAGNOSIS — M9901 Segmental and somatic dysfunction of cervical region: Secondary | ICD-10-CM | POA: Diagnosis not present

## 2018-09-12 DIAGNOSIS — M9903 Segmental and somatic dysfunction of lumbar region: Secondary | ICD-10-CM | POA: Diagnosis not present

## 2018-09-15 DIAGNOSIS — M542 Cervicalgia: Secondary | ICD-10-CM | POA: Diagnosis not present

## 2018-09-15 DIAGNOSIS — M6080 Other myositis, unspecified site: Secondary | ICD-10-CM | POA: Diagnosis not present

## 2018-09-15 DIAGNOSIS — M9901 Segmental and somatic dysfunction of cervical region: Secondary | ICD-10-CM | POA: Diagnosis not present

## 2018-09-15 DIAGNOSIS — M9903 Segmental and somatic dysfunction of lumbar region: Secondary | ICD-10-CM | POA: Diagnosis not present

## 2018-10-12 ENCOUNTER — Ambulatory Visit
Admission: RE | Admit: 2018-10-12 | Discharge: 2018-10-12 | Disposition: A | Discharge status: Home / Self Care | Payer: BC Managed Care – PPO | Source: Ambulatory Visit | Attending: Student | Admitting: Student

## 2018-10-12 ENCOUNTER — Other Ambulatory Visit: Payer: Self-pay | Admitting: Student

## 2018-10-12 ENCOUNTER — Other Ambulatory Visit: Payer: Self-pay

## 2018-10-12 DIAGNOSIS — N611 Abscess of the breast and nipple: Secondary | ICD-10-CM | POA: Diagnosis not present

## 2018-10-12 DIAGNOSIS — R928 Other abnormal and inconclusive findings on diagnostic imaging of breast: Secondary | ICD-10-CM | POA: Diagnosis not present

## 2018-10-17 LAB — AEROBIC/ANAEROBIC CULTURE W GRAM STAIN (SURGICAL/DEEP WOUND)

## 2018-10-18 ENCOUNTER — Other Ambulatory Visit: Payer: Self-pay | Admitting: Student

## 2018-10-18 DIAGNOSIS — N611 Abscess of the breast and nipple: Secondary | ICD-10-CM

## 2018-10-19 ENCOUNTER — Ambulatory Visit
Admission: RE | Admit: 2018-10-19 | Discharge: 2018-10-19 | Disposition: A | Discharge status: Home / Self Care | Payer: BC Managed Care – PPO | Source: Ambulatory Visit | Attending: Student | Admitting: Student

## 2018-10-19 ENCOUNTER — Other Ambulatory Visit: Payer: Self-pay | Admitting: Student

## 2018-10-19 ENCOUNTER — Other Ambulatory Visit: Payer: Self-pay

## 2018-10-19 DIAGNOSIS — N611 Abscess of the breast and nipple: Secondary | ICD-10-CM | POA: Diagnosis not present

## 2018-11-03 DIAGNOSIS — M6080 Other myositis, unspecified site: Secondary | ICD-10-CM | POA: Diagnosis not present

## 2018-11-03 DIAGNOSIS — M9903 Segmental and somatic dysfunction of lumbar region: Secondary | ICD-10-CM | POA: Diagnosis not present

## 2018-11-03 DIAGNOSIS — M542 Cervicalgia: Secondary | ICD-10-CM | POA: Diagnosis not present

## 2018-11-03 DIAGNOSIS — M9901 Segmental and somatic dysfunction of cervical region: Secondary | ICD-10-CM | POA: Diagnosis not present

## 2018-11-04 ENCOUNTER — Ambulatory Visit
Admission: RE | Admit: 2018-11-04 | Discharge: 2018-11-04 | Disposition: A | Discharge status: Home / Self Care | Payer: BC Managed Care – PPO | Source: Ambulatory Visit | Attending: Student | Admitting: Student

## 2018-11-04 ENCOUNTER — Other Ambulatory Visit: Payer: Self-pay

## 2018-11-04 DIAGNOSIS — N611 Abscess of the breast and nipple: Secondary | ICD-10-CM

## 2018-11-07 DIAGNOSIS — N611 Abscess of the breast and nipple: Secondary | ICD-10-CM | POA: Diagnosis not present

## 2018-11-10 DIAGNOSIS — M9903 Segmental and somatic dysfunction of lumbar region: Secondary | ICD-10-CM | POA: Diagnosis not present

## 2018-11-10 DIAGNOSIS — M542 Cervicalgia: Secondary | ICD-10-CM | POA: Diagnosis not present

## 2018-11-10 DIAGNOSIS — M6080 Other myositis, unspecified site: Secondary | ICD-10-CM | POA: Diagnosis not present

## 2018-11-10 DIAGNOSIS — M9901 Segmental and somatic dysfunction of cervical region: Secondary | ICD-10-CM | POA: Diagnosis not present

## 2018-11-14 DIAGNOSIS — M9903 Segmental and somatic dysfunction of lumbar region: Secondary | ICD-10-CM | POA: Diagnosis not present

## 2018-11-14 DIAGNOSIS — M6080 Other myositis, unspecified site: Secondary | ICD-10-CM | POA: Diagnosis not present

## 2018-11-14 DIAGNOSIS — M542 Cervicalgia: Secondary | ICD-10-CM | POA: Diagnosis not present

## 2018-11-14 DIAGNOSIS — M9901 Segmental and somatic dysfunction of cervical region: Secondary | ICD-10-CM | POA: Diagnosis not present

## 2018-11-15 DIAGNOSIS — G479 Sleep disorder, unspecified: Secondary | ICD-10-CM | POA: Diagnosis not present

## 2018-11-15 DIAGNOSIS — E1122 Type 2 diabetes mellitus with diabetic chronic kidney disease: Secondary | ICD-10-CM | POA: Diagnosis not present

## 2018-11-15 DIAGNOSIS — E782 Mixed hyperlipidemia: Secondary | ICD-10-CM | POA: Diagnosis not present

## 2018-11-15 DIAGNOSIS — F419 Anxiety disorder, unspecified: Secondary | ICD-10-CM | POA: Diagnosis not present

## 2018-11-17 DIAGNOSIS — M542 Cervicalgia: Secondary | ICD-10-CM | POA: Diagnosis not present

## 2018-11-17 DIAGNOSIS — M6080 Other myositis, unspecified site: Secondary | ICD-10-CM | POA: Diagnosis not present

## 2018-11-17 DIAGNOSIS — M9903 Segmental and somatic dysfunction of lumbar region: Secondary | ICD-10-CM | POA: Diagnosis not present

## 2018-11-17 DIAGNOSIS — M9901 Segmental and somatic dysfunction of cervical region: Secondary | ICD-10-CM | POA: Diagnosis not present

## 2018-11-23 DIAGNOSIS — M9903 Segmental and somatic dysfunction of lumbar region: Secondary | ICD-10-CM | POA: Diagnosis not present

## 2018-11-23 DIAGNOSIS — M6080 Other myositis, unspecified site: Secondary | ICD-10-CM | POA: Diagnosis not present

## 2018-11-23 DIAGNOSIS — M542 Cervicalgia: Secondary | ICD-10-CM | POA: Diagnosis not present

## 2018-11-23 DIAGNOSIS — M9901 Segmental and somatic dysfunction of cervical region: Secondary | ICD-10-CM | POA: Diagnosis not present

## 2018-11-30 DIAGNOSIS — M9901 Segmental and somatic dysfunction of cervical region: Secondary | ICD-10-CM | POA: Diagnosis not present

## 2018-11-30 DIAGNOSIS — M542 Cervicalgia: Secondary | ICD-10-CM | POA: Diagnosis not present

## 2018-11-30 DIAGNOSIS — M6080 Other myositis, unspecified site: Secondary | ICD-10-CM | POA: Diagnosis not present

## 2018-11-30 DIAGNOSIS — M9903 Segmental and somatic dysfunction of lumbar region: Secondary | ICD-10-CM | POA: Diagnosis not present

## 2018-12-01 DIAGNOSIS — M6080 Other myositis, unspecified site: Secondary | ICD-10-CM | POA: Diagnosis not present

## 2018-12-01 DIAGNOSIS — M542 Cervicalgia: Secondary | ICD-10-CM | POA: Diagnosis not present

## 2018-12-01 DIAGNOSIS — M9901 Segmental and somatic dysfunction of cervical region: Secondary | ICD-10-CM | POA: Diagnosis not present

## 2018-12-01 DIAGNOSIS — M9903 Segmental and somatic dysfunction of lumbar region: Secondary | ICD-10-CM | POA: Diagnosis not present

## 2018-12-02 DIAGNOSIS — N6019 Diffuse cystic mastopathy of unspecified breast: Secondary | ICD-10-CM | POA: Diagnosis not present

## 2018-12-06 DIAGNOSIS — M542 Cervicalgia: Secondary | ICD-10-CM | POA: Diagnosis not present

## 2018-12-06 DIAGNOSIS — M6080 Other myositis, unspecified site: Secondary | ICD-10-CM | POA: Diagnosis not present

## 2018-12-06 DIAGNOSIS — M9903 Segmental and somatic dysfunction of lumbar region: Secondary | ICD-10-CM | POA: Diagnosis not present

## 2018-12-06 DIAGNOSIS — M9901 Segmental and somatic dysfunction of cervical region: Secondary | ICD-10-CM | POA: Diagnosis not present

## 2018-12-07 DIAGNOSIS — M542 Cervicalgia: Secondary | ICD-10-CM | POA: Diagnosis not present

## 2018-12-07 DIAGNOSIS — M6080 Other myositis, unspecified site: Secondary | ICD-10-CM | POA: Diagnosis not present

## 2018-12-07 DIAGNOSIS — M9903 Segmental and somatic dysfunction of lumbar region: Secondary | ICD-10-CM | POA: Diagnosis not present

## 2018-12-07 DIAGNOSIS — M9901 Segmental and somatic dysfunction of cervical region: Secondary | ICD-10-CM | POA: Diagnosis not present

## 2018-12-08 DIAGNOSIS — M9903 Segmental and somatic dysfunction of lumbar region: Secondary | ICD-10-CM | POA: Diagnosis not present

## 2018-12-08 DIAGNOSIS — M6080 Other myositis, unspecified site: Secondary | ICD-10-CM | POA: Diagnosis not present

## 2018-12-08 DIAGNOSIS — M9901 Segmental and somatic dysfunction of cervical region: Secondary | ICD-10-CM | POA: Diagnosis not present

## 2018-12-08 DIAGNOSIS — M542 Cervicalgia: Secondary | ICD-10-CM | POA: Diagnosis not present

## 2018-12-12 DIAGNOSIS — M6080 Other myositis, unspecified site: Secondary | ICD-10-CM | POA: Diagnosis not present

## 2018-12-12 DIAGNOSIS — M9903 Segmental and somatic dysfunction of lumbar region: Secondary | ICD-10-CM | POA: Diagnosis not present

## 2018-12-12 DIAGNOSIS — M9901 Segmental and somatic dysfunction of cervical region: Secondary | ICD-10-CM | POA: Diagnosis not present

## 2018-12-12 DIAGNOSIS — M542 Cervicalgia: Secondary | ICD-10-CM | POA: Diagnosis not present

## 2018-12-15 DIAGNOSIS — M542 Cervicalgia: Secondary | ICD-10-CM | POA: Diagnosis not present

## 2018-12-15 DIAGNOSIS — M9901 Segmental and somatic dysfunction of cervical region: Secondary | ICD-10-CM | POA: Diagnosis not present

## 2018-12-15 DIAGNOSIS — M9903 Segmental and somatic dysfunction of lumbar region: Secondary | ICD-10-CM | POA: Diagnosis not present

## 2018-12-15 DIAGNOSIS — M6080 Other myositis, unspecified site: Secondary | ICD-10-CM | POA: Diagnosis not present

## 2018-12-19 DIAGNOSIS — M9901 Segmental and somatic dysfunction of cervical region: Secondary | ICD-10-CM | POA: Diagnosis not present

## 2018-12-19 DIAGNOSIS — M9903 Segmental and somatic dysfunction of lumbar region: Secondary | ICD-10-CM | POA: Diagnosis not present

## 2018-12-19 DIAGNOSIS — M542 Cervicalgia: Secondary | ICD-10-CM | POA: Diagnosis not present

## 2018-12-19 DIAGNOSIS — M6080 Other myositis, unspecified site: Secondary | ICD-10-CM | POA: Diagnosis not present

## 2018-12-22 DIAGNOSIS — M542 Cervicalgia: Secondary | ICD-10-CM | POA: Diagnosis not present

## 2018-12-22 DIAGNOSIS — M9903 Segmental and somatic dysfunction of lumbar region: Secondary | ICD-10-CM | POA: Diagnosis not present

## 2018-12-22 DIAGNOSIS — M9901 Segmental and somatic dysfunction of cervical region: Secondary | ICD-10-CM | POA: Diagnosis not present

## 2018-12-22 DIAGNOSIS — M6080 Other myositis, unspecified site: Secondary | ICD-10-CM | POA: Diagnosis not present

## 2018-12-28 DIAGNOSIS — M6080 Other myositis, unspecified site: Secondary | ICD-10-CM | POA: Diagnosis not present

## 2018-12-28 DIAGNOSIS — M9903 Segmental and somatic dysfunction of lumbar region: Secondary | ICD-10-CM | POA: Diagnosis not present

## 2018-12-28 DIAGNOSIS — M542 Cervicalgia: Secondary | ICD-10-CM | POA: Diagnosis not present

## 2018-12-28 DIAGNOSIS — M9901 Segmental and somatic dysfunction of cervical region: Secondary | ICD-10-CM | POA: Diagnosis not present

## 2018-12-30 DIAGNOSIS — N6019 Diffuse cystic mastopathy of unspecified breast: Secondary | ICD-10-CM | POA: Diagnosis not present

## 2018-12-30 DIAGNOSIS — N611 Abscess of the breast and nipple: Secondary | ICD-10-CM | POA: Diagnosis not present

## 2019-01-30 ENCOUNTER — Emergency Department (HOSPITAL_COMMUNITY)
Admission: EM | Admit: 2019-01-30 | Discharge: 2019-01-31 | Disposition: A | Discharge status: Home / Self Care | Payer: BC Managed Care – PPO | Attending: Emergency Medicine | Admitting: Emergency Medicine

## 2019-01-30 ENCOUNTER — Encounter (HOSPITAL_COMMUNITY): Payer: Self-pay | Admitting: *Deleted

## 2019-01-30 ENCOUNTER — Other Ambulatory Visit: Payer: Self-pay

## 2019-01-30 ENCOUNTER — Emergency Department (HOSPITAL_COMMUNITY): Payer: BC Managed Care – PPO

## 2019-01-30 DIAGNOSIS — Z79899 Other long term (current) drug therapy: Secondary | ICD-10-CM | POA: Insufficient documentation

## 2019-01-30 DIAGNOSIS — K529 Noninfective gastroenteritis and colitis, unspecified: Secondary | ICD-10-CM

## 2019-01-30 DIAGNOSIS — E119 Type 2 diabetes mellitus without complications: Secondary | ICD-10-CM | POA: Diagnosis not present

## 2019-01-30 DIAGNOSIS — J45909 Unspecified asthma, uncomplicated: Secondary | ICD-10-CM | POA: Diagnosis not present

## 2019-01-30 DIAGNOSIS — R197 Diarrhea, unspecified: Secondary | ICD-10-CM | POA: Diagnosis not present

## 2019-01-30 DIAGNOSIS — R109 Unspecified abdominal pain: Secondary | ICD-10-CM | POA: Diagnosis not present

## 2019-01-30 LAB — I-STAT BETA HCG BLOOD, ED (MC, WL, AP ONLY): I-stat hCG, quantitative: <5 m[IU]/mL (ref <5)

## 2019-01-30 LAB — C DIFFICILE QUICK SCREEN W PCR REFLEX
C Diff antigen: NEGATIVE
C Diff interpretation: NOT DETECTED
C Diff toxin: NEGATIVE

## 2019-01-30 LAB — URINALYSIS, ROUTINE W REFLEX MICROSCOPIC
Bilirubin Urine: NEGATIVE
Glucose, UA: NEGATIVE mg/dL
Hgb urine dipstick: NEGATIVE
Ketones, ur: 5 mg/dL — AB
Nitrite: NEGATIVE
Protein, ur: NEGATIVE mg/dL
Specific Gravity, Urine: 1.015 (ref 1.005–1.030)
pH: 6 (ref 5.0–8.0)

## 2019-01-30 LAB — MAGNESIUM: Magnesium: 2 mg/dL (ref 1.7–2.4)

## 2019-01-30 LAB — CBC
HCT: 47.7 % — ABNORMAL HIGH (ref 36.0–46.0)
Hemoglobin: 15.8 g/dL — ABNORMAL HIGH (ref 12.0–15.0)
MCH: 31 pg (ref 26.0–34.0)
MCHC: 33.1 g/dL (ref 30.0–36.0)
MCV: 93.7 fL (ref 80.0–100.0)
Platelets: 375 10*3/uL (ref 150–400)
RBC: 5.09 MIL/uL (ref 3.87–5.11)
RDW: 13.9 % (ref 11.5–15.5)
WBC: 18.2 10*3/uL — ABNORMAL HIGH (ref 4.0–10.5)
nRBC: 0 % (ref 0.0–0.2)

## 2019-01-30 LAB — LIPASE, BLOOD: Lipase: 18 U/L (ref 11–51)

## 2019-01-30 LAB — COMPREHENSIVE METABOLIC PANEL
ALT: 15 U/L (ref 0–44)
AST: 16 U/L (ref 15–41)
Albumin: 3.5 g/dL (ref 3.5–5.0)
Alkaline Phosphatase: 72 U/L (ref 38–126)
Anion gap: 14 (ref 5–15)
BUN: 6 mg/dL (ref 6–20)
CO2: 21 mmol/L — ABNORMAL LOW (ref 22–32)
Calcium: 8.9 mg/dL (ref 8.9–10.3)
Chloride: 101 mmol/L (ref 98–111)
Creatinine, Ser: 0.6 mg/dL (ref 0.44–1.00)
GFR calc Af Amer: >60 mL/min (ref >60)
GFR calc non Af Amer: >60 mL/min (ref >60)
Glucose, Bld: 109 mg/dL — ABNORMAL HIGH (ref 70–99)
Potassium: 3.9 mmol/L (ref 3.5–5.1)
Sodium: 136 mmol/L (ref 135–145)
Total Bilirubin: 0.4 mg/dL (ref 0.3–1.2)
Total Protein: 7 g/dL (ref 6.5–8.1)

## 2019-01-30 LAB — CBG MONITORING, ED: Glucose-Capillary: 105 mg/dL — ABNORMAL HIGH (ref 70–99)

## 2019-01-30 MED ORDER — SODIUM CHLORIDE 0.9 % IV BOLUS
1000.0000 mL | Freq: Once | INTRAVENOUS | Status: AC
  Administered 2019-01-30: 1000 mL via INTRAVENOUS

## 2019-01-30 MED ORDER — CIPROFLOXACIN HCL 500 MG PO TABS: 500.0000 mg | ORAL_TABLET | Freq: Two times a day (BID) | ORAL | 0 refills | Status: AC

## 2019-01-30 MED ORDER — CIPROFLOXACIN IN D5W 400 MG/200ML IV SOLN
400.0000 mg | Freq: Once | INTRAVENOUS | Status: AC
  Administered 2019-01-30: 400 mg via INTRAVENOUS
  Filled 2019-01-30: qty 200

## 2019-01-30 MED ORDER — METRONIDAZOLE IN NACL 5-0.79 MG/ML-% IV SOLN
500.0000 mg | Freq: Once | INTRAVENOUS | Status: AC
  Administered 2019-01-30: 500 mg via INTRAVENOUS
  Filled 2019-01-30: qty 100

## 2019-01-30 MED ORDER — SODIUM CHLORIDE 0.9% FLUSH: 3.0000 mL | Freq: Once | INTRAVENOUS | Status: DC

## 2019-01-30 MED ORDER — METRONIDAZOLE 500 MG PO TABS: 500.0000 mg | ORAL_TABLET | Freq: Two times a day (BID) | ORAL | 0 refills | Status: AC

## 2019-01-30 MED ORDER — IOHEXOL 300 MG/ML  SOLN
100.0000 mL | Freq: Once | INTRAMUSCULAR | Status: AC | PRN
  Administered 2019-01-30: 100 mL via INTRAVENOUS

## 2019-01-30 MED ORDER — PROMETHAZINE HCL 25 MG PO TABS: 25.0000 mg | ORAL_TABLET | Freq: Four times a day (QID) | ORAL | 0 refills | Status: AC | PRN

## 2019-01-30 NOTE — ED Triage Notes (Signed)
Pt states diarrhea approx 3 x's every hour x 10 days, while awake.  Now she feels weak and is noticing blood in her stool.

## 2019-01-30 NOTE — Discharge Instructions (Signed)
Your testing today reveals that you have what appears to be colitis.  This is inflammation or infection of your colon which is causing her diarrhea and some of the blood.  We have started you on 2 different antibiotics to help assuming that this is likely infectious.  Please take the following medications  Cipro, 500 mg twice daily Flagyl, 500 mg twice daily  Please seek a follow-up exam with your doctor within 3 days for recheck.  They will need to go over your stool test with you to make sure there is no signs of infection in your testing.  That will not come back today.  Return to the emergency department for increasing pain vomiting or fever

## 2019-01-30 NOTE — ED Provider Notes (Addendum)
[facility] Provider Note   CSN: [id number] Arrival date & time: 01/30/19  1446     History   Chief Complaint Chief Complaint  Patient presents with   Diarrhea   Fatigue    HPI [facility] is a 53 y.o. female.     HPI  53 year old female, has a history of diabetes, history of breast abscess which has been recurrent and is doing very well recently however has in the last 9 days developed diffuse watery diarrhea associated with abdominal cramping which is been rather persistent up to 3 times an hour, has very small volume output but has increasing pain and is now had bloody stools for the last 24 hours.  She reports that she has no fevers or chills, she is not nauseated but has had very little to eat today because this prompts a bowel movement.  She called her family doctor who told her to come to the hospital for further evaluation.  She denies any prior significant abdominal surgery other than appendicitis last year.  Her sister has been diagnosed with some type of colitis as a chronic condition.  The patient has never had an issue like this before.  She states it started within hours after eating a quarter pounder with cheese which she had not had in 5 years previous to that.  Past Medical History:  Diagnosis Date   Anxiety    Asthma    Basal cell carcinoma    "cut off right lip/nose area" (12/29/2017)   Breast discharge    clear to milky/ one episode bloody   Breast injury 06/25/2016   Family history of adverse reaction to anesthesia    "think my older sister had problems w/nausea & vomiting" (12/29/2017)   GERD (gastroesophageal reflux disease)    High cholesterol    Migraine    "a few/year" (12/29/2017)   Type II diabetes mellitus ([place])     Patient Active Problem List   Diagnosis Date Noted   Acute appendicitis 12/29/2017    Past Surgical History:  Procedure Laterality Date   APPENDECTOMY  12/29/2017   BREAST  EXCISIONAL BIOPSY Right 03/07/2018   EXCISION OF BREAST LESION Right 03/07/2018   Procedure: EXCISION OF CHRONIC RIGHT BREAST ABSCESS;  Surgeon: Stark Klein, MD;  Location: [facility];  Service: General;  Laterality: Right;   LAPAROSCOPIC APPENDECTOMY N/A 12/29/2017   Procedure: APPENDECTOMY LAPAROSCOPIC;  Surgeon: Jovita Kussmaul, MD;  Location: [facility];  Service: General;  Laterality: N/A;     OB History   No obstetric history on file.      Home Medications    Prior to Admission medications   Medication Sig Start Date End Date Taking? Authorizing Provider  albuterol (PROVENTIL HFA;VENTOLIN HFA) 108 (90 Base) MCG/ACT inhaler Inhale 1-2 puffs into the lungs every 6 (six) hours as needed for wheezing or shortness of breath.   Yes [provider]  Calcium Carbonate Antacid (TUMS PO) Take 1 tablet by mouth daily as needed (acid reflux).   Yes [provider]  cetirizine (ZYRTEC) 10 MG tablet Take 10 mg by mouth daily as needed for allergies.   Yes [provider]  Cholecalciferol (VITAMIN D3) 5000 units CAPS Take 5,000 Units by mouth See admin instructions. Take one capsule (5000 units) by mouth daily for 2 weeks, hold for 2 weeks and then repeat   Yes [provider]  clonazePAM (KLONOPIN) 1 MG tablet Take 1 mg by mouth at bedtime.  Yes [provider]  diphenhydrAMINE (BENADRYL) 12.5 MG/5ML liquid Take 12.5 mg by mouth daily as needed for allergies.   Yes [provider]  EPINEPHrine 0.3 mg/0.3 mL IJ SOAJ injection Inject 0.3 mg into the muscle daily as needed for anaphylaxis (severe allergic reaction).    Yes [provider]  escitalopram (LEXAPRO) 20 MG tablet Take 20 mg by mouth daily.   Yes [provider]  ezetimibe (ZETIA) 10 MG tablet Take 10 mg by mouth at bedtime.    Yes [provider]  hydrOXYzine (ATARAX/VISTARIL) 10 MG tablet Take 10 mg by mouth 3 (three) times daily as needed for itching.  11/15/18  Yes  [provider]  ibuprofen (ADVIL,MOTRIN) 200 MG tablet Take 400 mg by mouth every 6 (six) hours as needed for moderate pain.   Yes [provider]  ketotifen (ALAWAY) 0.025 % ophthalmic solution Place 1 drop into both eyes 2 (two) times daily as needed (allergies).    Yes [provider]  metFORMIN (GLUCOPHAGE-XR) 500 MG 24 hr tablet Take 500 mg by mouth 2 (two) times daily with a meal. 01/29/19  Yes [provider]  Multiple Vitamin (MULTIVITAMIN WITH MINERALS) TABS tablet Take 1 tablet by mouth daily. Women's 50 plus   Yes [provider]  neomycin-bacitracin-polymyxin (NEOSPORIN) OINT Apply 1 application topically daily as needed for wound care.   Yes [provider]  valACYclovir (VALTREX) 1000 MG tablet Take 1,000 mg by mouth every 12 (twelve) hours as needed (fever blisters).   Yes [provider]  ciprofloxacin (CIPRO) 500 MG tablet Take 1 tablet (500 mg total) by mouth every 12 (twelve) hours. 01/30/19   Noemi Chapel, MD  metroNIDAZOLE (FLAGYL) 500 MG tablet Take 1 tablet (500 mg total) by mouth 2 (two) times daily for 10 days. 01/30/19 02/09/19  Noemi Chapel, MD  promethazine (PHENERGAN) 25 MG tablet Take 1 tablet (25 mg total) by mouth every 6 (six) hours as needed for nausea or vomiting. 01/30/19   Noemi Chapel, MD    Family History No family history on file.  Social History Social History   Tobacco Use   Smoking status: Current Every Day Smoker    Packs/day: 1.00    Years: 30.00    Pack years: 30.00    Types: Cigarettes   Smokeless tobacco: Never Used  Substance Use Topics   Alcohol use: Not Currently   Drug use: Never     Allergies   Peanut-containing drug products, Penicillins, Shellfish allergy, Almond (diagnostic), Monistat [miconazole], Other, and Latex   Review of Systems Review of Systems  All other systems reviewed and are negative.    Physical Exam Updated Vital Signs BP 124/62    Pulse  89    Temp 98.5 F (36.9 C) (Oral)    Resp 16    Ht 1.638 m (5' 4.5")    Wt 85.3 kg    SpO2 94%    BMI 31.77 kg/m   Physical Exam Vitals signs and nursing note reviewed.  Constitutional:      General: She is not in acute distress.    Appearance: She is well-developed.  HENT:     Head: Normocephalic and atraumatic.     Mouth/Throat:     Pharynx: No oropharyngeal exudate.  Eyes:     General: No scleral icterus.       Right eye: No discharge.        Left eye: No discharge.     Conjunctiva/sclera: Conjunctivae normal.  Pupils: Pupils are equal, round, and reactive to light.  Neck:     Musculoskeletal: Normal range of motion and neck supple.     Thyroid: No thyromegaly.     Vascular: No JVD.  Cardiovascular:     Rate and Rhythm: Normal rate and regular rhythm.     Heart sounds: Normal heart sounds. No murmur. No friction rub. No gallop.   Pulmonary:     Effort: Pulmonary effort is normal. No respiratory distress.     Breath sounds: Normal breath sounds. No wheezing or rales.  Abdominal:     General: Bowel sounds are normal. There is no distension.     Palpations: Abdomen is soft. There is no mass.     Tenderness: There is abdominal tenderness.     Comments: Normal bowel sounds, increased abdominal tenderness right lower quadrant as well as right upper and mid abdomen, there is no tenderness in the left lower quadrant.  There is no tympanitic sounds to percussion.  Musculoskeletal: Normal range of motion.        General: No tenderness.  Lymphadenopathy:     Cervical: No cervical adenopathy.  Skin:    General: Skin is warm and dry.     Findings: No erythema or rash.  Neurological:     Mental Status: She is alert.     Coordination: Coordination normal.  Psychiatric:        Behavior: Behavior normal.      ED Treatments / Results  Labs (all labs ordered are listed, but only abnormal results are displayed) Labs Reviewed  COMPREHENSIVE METABOLIC PANEL - Abnormal; Notable  for the following components:      Result Value   CO2 21 (*)    Glucose, Bld 109 (*)    All other components within normal limits  CBC - Abnormal; Notable for the following components:   WBC 18.2 (*)    Hemoglobin 15.8 (*)    HCT 47.7 (*)    All other components within normal limits  URINALYSIS, ROUTINE W REFLEX MICROSCOPIC - Abnormal; Notable for the following components:   APPearance HAZY (*)    Ketones, ur 5 (*)    Leukocytes,Ua MODERATE (*)    Bacteria, UA RARE (*)    All other components within normal limits  CBG MONITORING, ED - Abnormal; Notable for the following components:   Glucose-Capillary 105 (*)    All other components within normal limits  C DIFFICILE QUICK SCREEN W PCR REFLEX  GI PATHOGEN PANEL BY PCR, STOOL  LIPASE, BLOOD  MAGNESIUM  I-STAT BETA HCG BLOOD, ED ([facility], [facility], [facility] ONLY)     Radiology Ct Abdomen Pelvis W Contrast  Result Date: 01/30/2019 CLINICAL DATA:  Fatigue colitis gastroenteritis diarrhea EXAM: CT ABDOMEN AND PELVIS WITH CONTRAST TECHNIQUE: Multidetector CT imaging of the abdomen and pelvis was performed using the standard protocol following bolus administration of intravenous contrast. CONTRAST:  119mL OMNIPAQUE IOHEXOL 300 MG/ML  SOLN COMPARISON:  CT 12/29/2017 FINDINGS: Lower chest: Lung bases demonstrate no acute consolidation or effusion. The heart size is within normal limits. 3.8 cm fluid density lesion at the right cardio phrenic angle, likely a pericardial cyst. No change. Hepatobiliary: Subcentimeter hypodensity within the medial inferior right hepatic lobe, too small to further characterize. No calcified gallstone or biliary dilatation Pancreas: Unremarkable. No pancreatic ductal dilatation or surrounding inflammatory changes. Spleen: Normal in size without focal abnormality. Adrenals/Urinary Tract: Adrenal glands are unremarkable. Kidneys are normal, without renal calculi, focal lesion, or hydronephrosis. Bladder is  unremarkable. Stomach/Bowel:  Stomach is nonenlarged. No dilated small bowel. Diffuse colon wall thickening with mucosal enhancement, most notable at the descending and rectosigmoid colon. Status post appendectomy. Vascular/Lymphatic: Moderate aortic atherosclerosis. No aneurysmal dilatation. No significantly enlarged lymph nodes Reproductive: Uterus and bilateral adnexa are unremarkable. Other: Negative for free air or free fluid Musculoskeletal: No acute or significant osseous findings. IMPRESSION: 1. Diffuse colon wall thickening and mucosal enhancement, consistent with pancolitis, which may be secondary to infection to include C difficile colitis, inflammatory bowel disease, or less likely ischemia. Negative for perforation. 2. 3.8 cm fluid density at the right cardio phrenic angle likely a pericardial cyst. Electronically Signed   By: Donavan Foil M.D.   On: 01/30/2019 23:26    Procedures Procedures (including critical care time)  Medications Ordered in ED Medications  sodium chloride flush (NS) 0.9 % injection 3 mL (has no administration in time range)  ciprofloxacin (CIPRO) IVPB 400 mg (400 mg Intravenous New Bag/Given 01/30/19 2315)  metroNIDAZOLE (FLAGYL) IVPB 500 mg (500 mg Intravenous New Bag/Given 01/30/19 2317)  sodium chloride 0.9 % bolus 1,000 mL (0 mLs Intravenous Stopped 01/30/19 2030)  iohexol (OMNIPAQUE) 300 MG/ML solution 100 mL (100 mLs Intravenous Contrast Given 01/30/19 2115)     Initial Impression / Assessment and Plan / ED Course  I have reviewed the triage vital signs and the nursing notes.  Pertinent labs & imaging results that were available during my care of the patient were reviewed by me and considered in my medical decision making (see chart for details).        The patient has a leukocytosis, there is no anemia, preserved renal function.  Suspect some dehydration however the patient likely has colitis, whether this is ischemic inflammatory or infectious is questionable, labs will be ordered  including C. difficile as well as a pathogen stool panel, urinalysis, hydration and a CT scan.  The patient is agreeable to the plan.  CT scan reveals pancolitis, again the patient is well-appearing, nonsurgical abdomen, stable for discharge after being treated with Cipro and Flagyl and IV fluids.  The patient was informed of her results prior to discharge, she was appreciative for the care and the understanding of the results and the need to follow-up closely.  Agreeable to return should symptoms worsen.  Also aware that she needs to follow-up with a gastroenterologist due to the family history of some type of colitis.  Nonsurgical abdomen at discharge, improved symptoms after fluids and antibiotics    Final Clinical Impressions(s) / ED Diagnoses   Final diagnoses:  Colitis    ED Discharge Orders         Ordered    ciprofloxacin (CIPRO) 500 MG tablet  Every 12 hours     01/30/19 2335    metroNIDAZOLE (FLAGYL) 500 MG tablet  2 times daily     01/30/19 2335    promethazine (PHENERGAN) 25 MG tablet  Every 6 hours PRN     01/30/19 2335           Noemi Chapel, MD 01/30/19 [id number]    Noemi Chapel, MD 01/30/19 Darci Needle    Noemi Chapel, MD 01/30/19 [phone]

## 2019-01-30 NOTE — ED Notes (Signed)
Patient transported to CT 

## 2019-02-02 LAB — GI PATHOGEN PANEL BY PCR, STOOL

## 2019-03-01 DIAGNOSIS — R197 Diarrhea, unspecified: Secondary | ICD-10-CM | POA: Diagnosis not present

## 2019-03-01 DIAGNOSIS — K625 Hemorrhage of anus and rectum: Secondary | ICD-10-CM | POA: Diagnosis not present

## 2019-03-01 DIAGNOSIS — R933 Abnormal findings on diagnostic imaging of other parts of digestive tract: Secondary | ICD-10-CM | POA: Diagnosis not present

## 2019-03-01 DIAGNOSIS — K529 Noninfective gastroenteritis and colitis, unspecified: Secondary | ICD-10-CM | POA: Diagnosis not present

## 2019-03-02 DIAGNOSIS — R197 Diarrhea, unspecified: Secondary | ICD-10-CM | POA: Diagnosis not present

## 2019-03-02 DIAGNOSIS — K625 Hemorrhage of anus and rectum: Secondary | ICD-10-CM | POA: Diagnosis not present

## 2019-03-06 DIAGNOSIS — Z1159 Encounter for screening for other viral diseases: Secondary | ICD-10-CM | POA: Diagnosis not present

## 2019-03-09 DIAGNOSIS — R933 Abnormal findings on diagnostic imaging of other parts of digestive tract: Secondary | ICD-10-CM | POA: Diagnosis not present

## 2019-03-09 DIAGNOSIS — R634 Abnormal weight loss: Secondary | ICD-10-CM | POA: Diagnosis not present

## 2019-03-09 DIAGNOSIS — K6389 Other specified diseases of intestine: Secondary | ICD-10-CM | POA: Diagnosis not present

## 2019-03-09 DIAGNOSIS — K625 Hemorrhage of anus and rectum: Secondary | ICD-10-CM | POA: Diagnosis not present

## 2019-03-09 DIAGNOSIS — K5289 Other specified noninfective gastroenteritis and colitis: Secondary | ICD-10-CM | POA: Diagnosis not present

## 2019-03-09 DIAGNOSIS — R197 Diarrhea, unspecified: Secondary | ICD-10-CM | POA: Diagnosis not present

## 2019-03-09 DIAGNOSIS — K6289 Other specified diseases of anus and rectum: Secondary | ICD-10-CM | POA: Diagnosis not present

## 2019-03-29 DIAGNOSIS — K5100 Ulcerative (chronic) pancolitis without complications: Secondary | ICD-10-CM | POA: Diagnosis not present

## 2019-05-08 DIAGNOSIS — Z0000 Encounter for general adult medical examination without abnormal findings: Secondary | ICD-10-CM | POA: Diagnosis not present

## 2019-05-13 IMAGING — CT CT ABD-PELV W/ CM
2 of 5 series · 16 of 46 positions shown, 18 images · IV contrast (iopamidol)
Comparison: None.

CLINICAL DATA: Mid to lower abdominal pain, nausea and vomiting,
started yesterday.

EXAM:
CT ABDOMEN AND PELVIS WITH CONTRAST
TECHNIQUE: Multidetector CT imaging of the abdomen and pelvis was performed
using the standard protocol following bolus administration of
intravenous contrast.
CONTRAST:  100mL 4O92QM-PEE IOPAMIDOL (4O92QM-[id number]) INJECTION 61%

[Series 3: abdomen 5.0 · axial · 0.68mm/px · z∈[+725,+1145]mm · 13 of 98 slices shown, 15 images]
[im 7/98  soft-tissue]
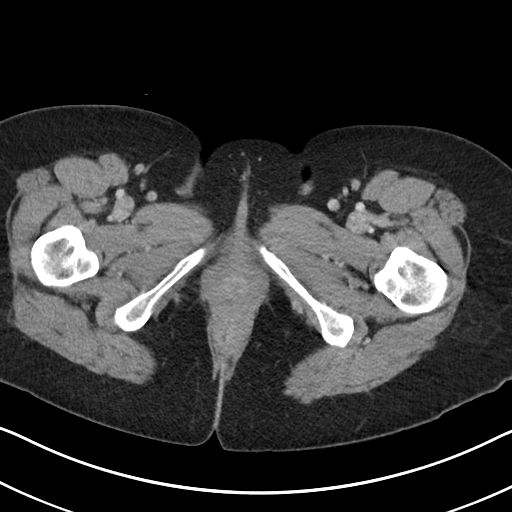
[im 7/98  bone]
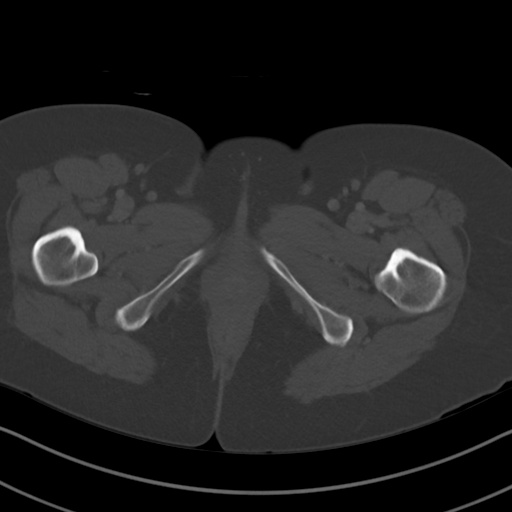
[im 14/98  soft-tissue]
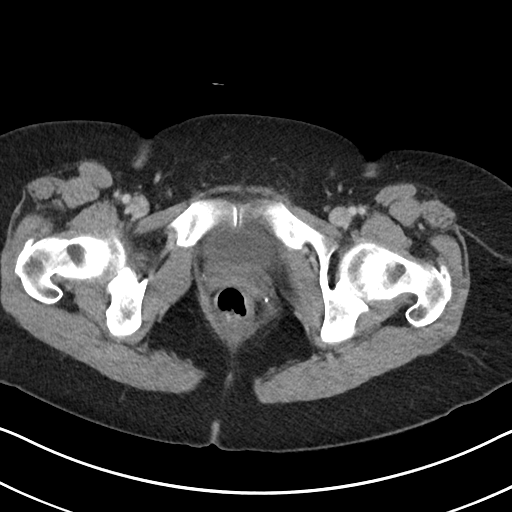
[im 21/98  soft-tissue]
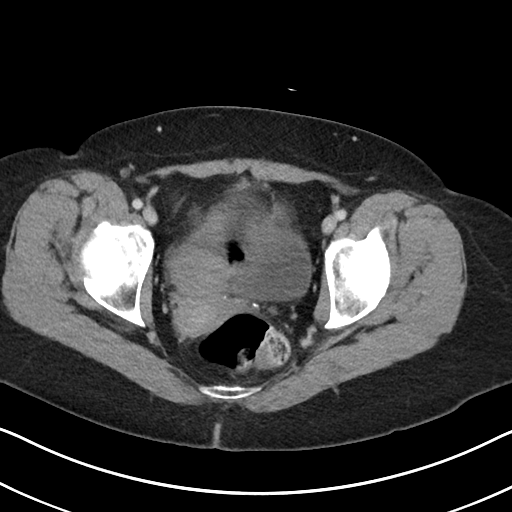
[im 28/98  soft-tissue]
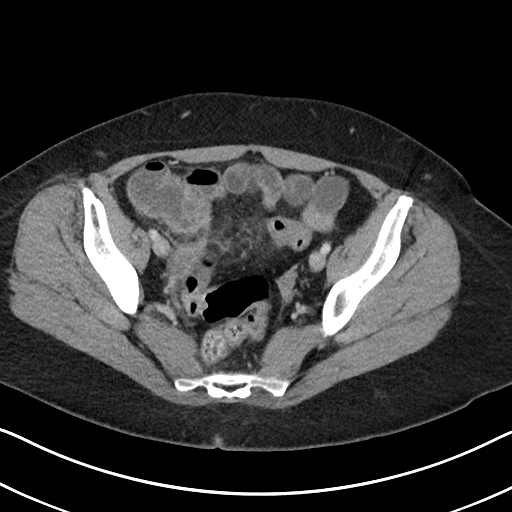
[im 35/98  soft-tissue]
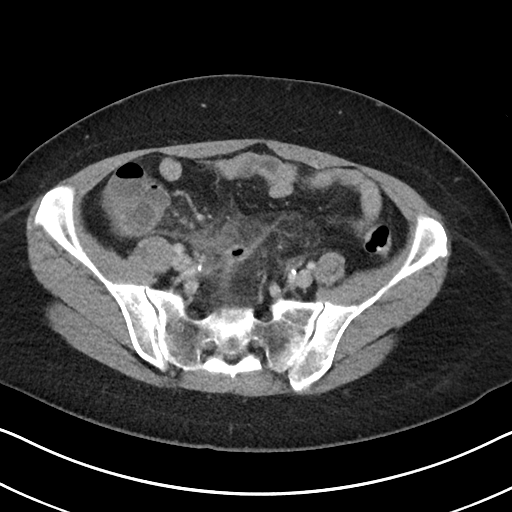
[im 42/98  soft-tissue]
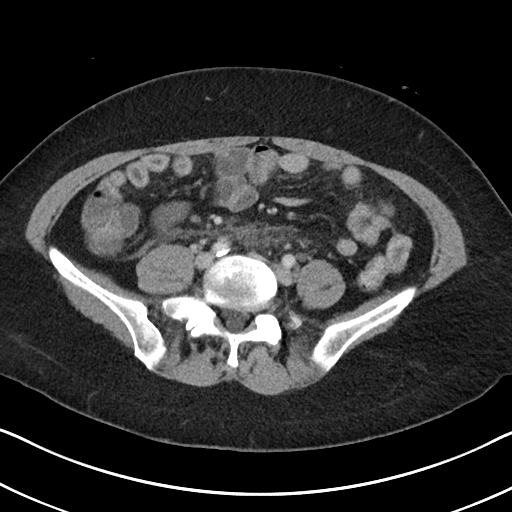
[im 49/98  soft-tissue]
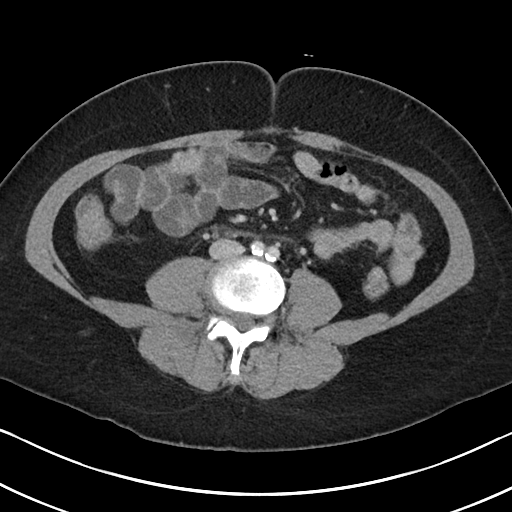
[im 56/98  soft-tissue]
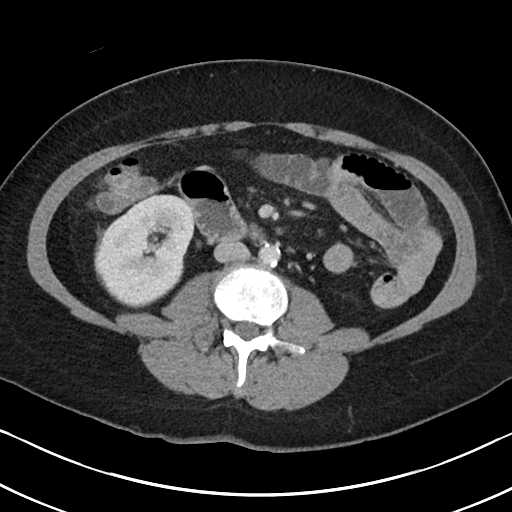
[im 63/98  soft-tissue]
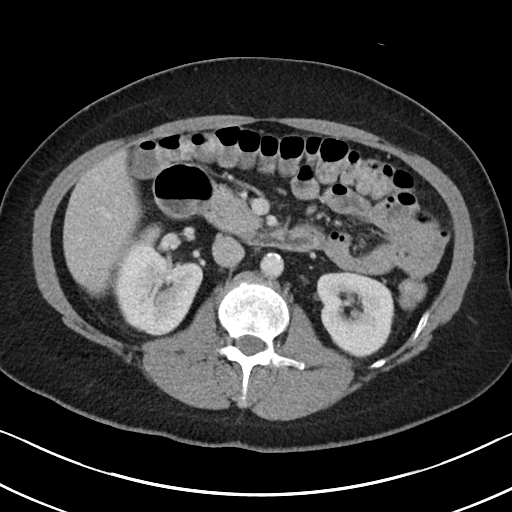
[im 63/98  bone]
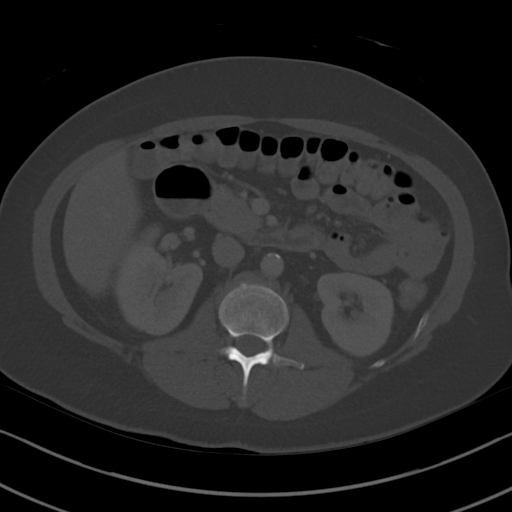
[im 70/98  soft-tissue]
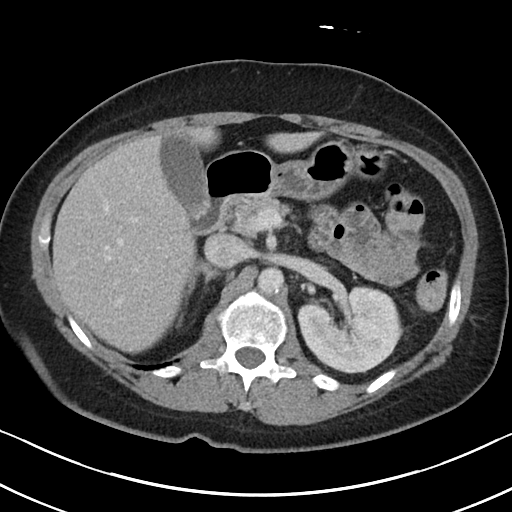
[im 77/98  soft-tissue]
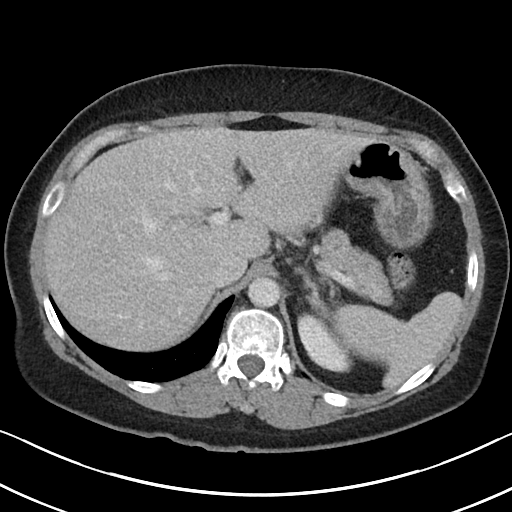
[im 84/98  soft-tissue]
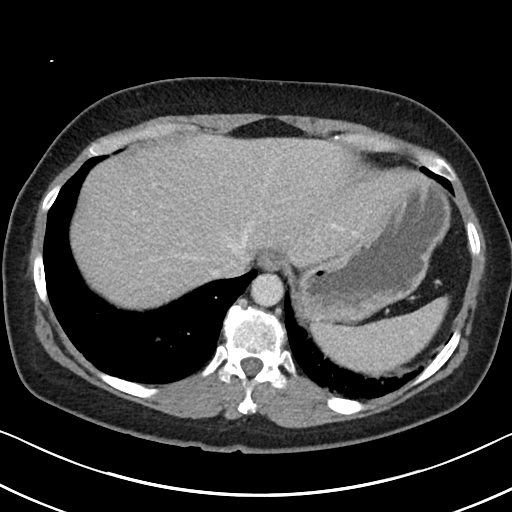
[im 91/98  soft-tissue]
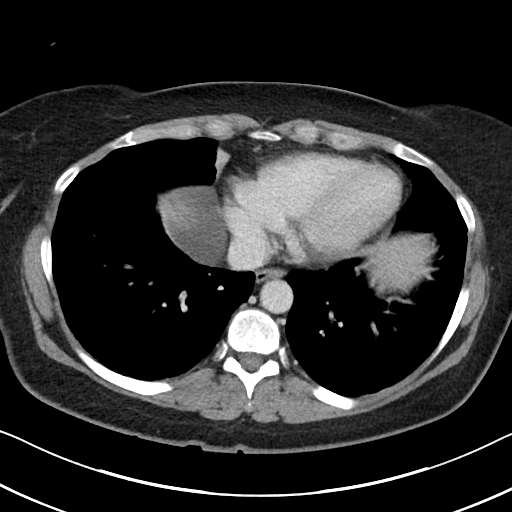

[Series 6: abdomen 3.0 mpr cor · coronal · 0.78mm/px · 3 of 79 slices shown]
[im 27/79  soft-tissue]
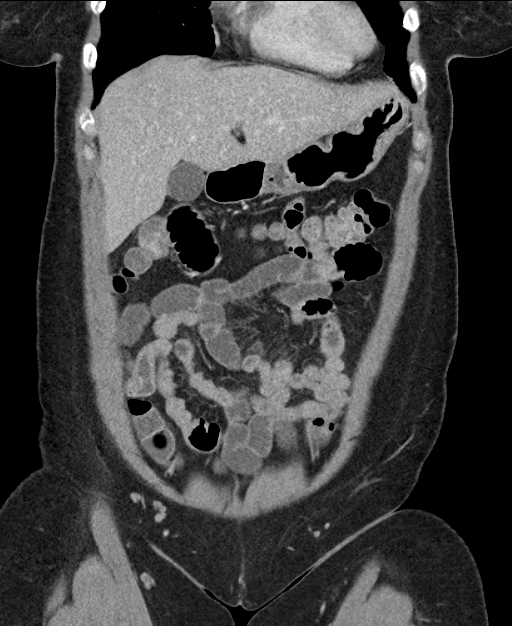
[im 35/79  soft-tissue]
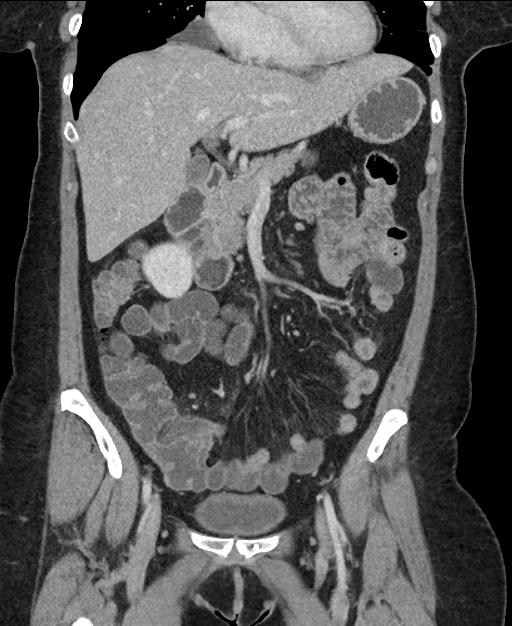
[im 44/79  soft-tissue]
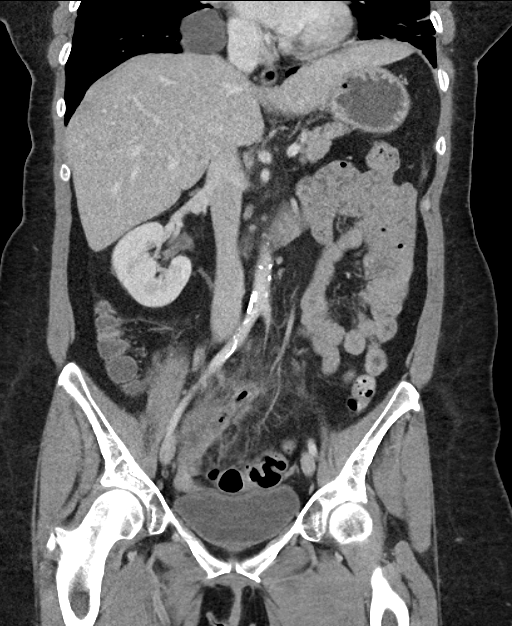

[16 of 46 positions shown; findings below may reference images not displayed]

FINDINGS: Lower chest: Infiltration in the left lung base, possibly pneumonia.
Right cardiophrenic angle cyst, probably a pericardial cyst.

Hepatobiliary: No focal liver abnormality is seen. No gallstones,
gallbladder wall thickening, or biliary dilatation.

Pancreas: Unremarkable. No pancreatic ductal dilatation or
surrounding inflammatory changes.

Spleen: Normal in size without focal abnormality.

Adrenals/Urinary Tract: Adrenal glands are unremarkable. Kidneys are
normal, without renal calculi, focal lesion, or hydronephrosis.
Bladder is unremarkable.

Stomach/Bowel: Stomach, small bowel, and colon are not abnormally
distended. Significant appendiceal dilatation with diameter
measuring up to about 12 mm. Prominent periappendiceal stranding and
edema. Changes are consistent with acute appendicitis.

Appendix: Location: Medial to the cecum and extending towards the
midline in the posterior pelvis

Diameter: 12 mm

Appendicolith: No

Mucosal hyper-enhancement: Yes

Extraluminal gas: No

Periappendiceal collection: No loculated collection. Significant
stranding and edema.

Vascular/Lymphatic: Aortic atherosclerosis. No enlarged abdominal or
pelvic lymph nodes.

Reproductive: Uterus and bilateral adnexa are unremarkable.

Other: No free air or free fluid in the abdomen. Abdominal wall
musculature appears intact.

Musculoskeletal: No acute or significant osseous findings.
IMPRESSION: 1. Acute appendicitis with appendiceal dilatation and
periappendiceal stranding. No evidence of perforation or abscess.
2. Infiltration in the left lung base may indicate pneumonia.

Aortic Atherosclerosis ([id number]).

## 2019-05-15 DIAGNOSIS — E782 Mixed hyperlipidemia: Secondary | ICD-10-CM | POA: Diagnosis not present

## 2019-05-15 DIAGNOSIS — Z01411 Encounter for gynecological examination (general) (routine) with abnormal findings: Secondary | ICD-10-CM | POA: Diagnosis not present

## 2019-05-15 DIAGNOSIS — N611 Abscess of the breast and nipple: Secondary | ICD-10-CM | POA: Diagnosis not present

## 2019-05-15 DIAGNOSIS — E1122 Type 2 diabetes mellitus with diabetic chronic kidney disease: Secondary | ICD-10-CM | POA: Diagnosis not present

## 2019-05-15 DIAGNOSIS — Z79899 Other long term (current) drug therapy: Secondary | ICD-10-CM | POA: Diagnosis not present

## 2019-05-15 DIAGNOSIS — R8789 Other abnormal findings in specimens from female genital organs: Secondary | ICD-10-CM | POA: Diagnosis not present

## 2019-05-15 DIAGNOSIS — E559 Vitamin D deficiency, unspecified: Secondary | ICD-10-CM | POA: Diagnosis not present

## 2019-06-14 ENCOUNTER — Other Ambulatory Visit: Payer: Self-pay | Admitting: Obstetrics and Gynecology

## 2019-06-14 DIAGNOSIS — N72 Inflammatory disease of cervix uteri: Secondary | ICD-10-CM | POA: Diagnosis not present

## 2019-06-14 DIAGNOSIS — R87810 Cervical high risk human papillomavirus (HPV) DNA test positive: Secondary | ICD-10-CM | POA: Diagnosis not present

## 2019-07-12 DIAGNOSIS — R1013 Epigastric pain: Secondary | ICD-10-CM | POA: Diagnosis not present

## 2019-07-12 DIAGNOSIS — K5100 Ulcerative (chronic) pancolitis without complications: Secondary | ICD-10-CM | POA: Diagnosis not present

## 2019-07-15 DIAGNOSIS — Z23 Encounter for immunization: Secondary | ICD-10-CM | POA: Diagnosis not present

## 2019-08-01 DIAGNOSIS — Z6831 Body mass index (BMI) 31.0-31.9, adult: Secondary | ICD-10-CM | POA: Diagnosis not present

## 2019-08-01 DIAGNOSIS — Z8719 Personal history of other diseases of the digestive system: Secondary | ICD-10-CM | POA: Diagnosis not present

## 2019-08-01 DIAGNOSIS — E1165 Type 2 diabetes mellitus with hyperglycemia: Secondary | ICD-10-CM | POA: Diagnosis not present

## 2019-08-01 DIAGNOSIS — F411 Generalized anxiety disorder: Secondary | ICD-10-CM | POA: Diagnosis not present

## 2019-08-09 DIAGNOSIS — M542 Cervicalgia: Secondary | ICD-10-CM | POA: Diagnosis not present

## 2019-08-09 DIAGNOSIS — M9901 Segmental and somatic dysfunction of cervical region: Secondary | ICD-10-CM | POA: Diagnosis not present

## 2019-08-09 DIAGNOSIS — M6080 Other myositis, unspecified site: Secondary | ICD-10-CM | POA: Diagnosis not present

## 2019-08-09 DIAGNOSIS — M9903 Segmental and somatic dysfunction of lumbar region: Secondary | ICD-10-CM | POA: Diagnosis not present

## 2019-08-10 DIAGNOSIS — M542 Cervicalgia: Secondary | ICD-10-CM | POA: Diagnosis not present

## 2019-08-10 DIAGNOSIS — M9901 Segmental and somatic dysfunction of cervical region: Secondary | ICD-10-CM | POA: Diagnosis not present

## 2019-08-10 DIAGNOSIS — M9903 Segmental and somatic dysfunction of lumbar region: Secondary | ICD-10-CM | POA: Diagnosis not present

## 2019-08-10 DIAGNOSIS — M6080 Other myositis, unspecified site: Secondary | ICD-10-CM | POA: Diagnosis not present

## 2019-08-12 DIAGNOSIS — Z23 Encounter for immunization: Secondary | ICD-10-CM | POA: Diagnosis not present

## 2019-08-16 DIAGNOSIS — Z8719 Personal history of other diseases of the digestive system: Secondary | ICD-10-CM | POA: Diagnosis not present

## 2019-08-16 DIAGNOSIS — E1165 Type 2 diabetes mellitus with hyperglycemia: Secondary | ICD-10-CM | POA: Diagnosis not present

## 2019-08-16 DIAGNOSIS — Z6831 Body mass index (BMI) 31.0-31.9, adult: Secondary | ICD-10-CM | POA: Diagnosis not present

## 2019-08-16 DIAGNOSIS — F411 Generalized anxiety disorder: Secondary | ICD-10-CM | POA: Diagnosis not present

## 2019-10-11 DIAGNOSIS — M9901 Segmental and somatic dysfunction of cervical region: Secondary | ICD-10-CM | POA: Diagnosis not present

## 2019-10-11 DIAGNOSIS — M9903 Segmental and somatic dysfunction of lumbar region: Secondary | ICD-10-CM | POA: Diagnosis not present

## 2019-10-11 DIAGNOSIS — M6080 Other myositis, unspecified site: Secondary | ICD-10-CM | POA: Diagnosis not present

## 2019-10-11 DIAGNOSIS — M542 Cervicalgia: Secondary | ICD-10-CM | POA: Diagnosis not present

## 2019-10-12 DIAGNOSIS — M9901 Segmental and somatic dysfunction of cervical region: Secondary | ICD-10-CM | POA: Diagnosis not present

## 2019-10-12 DIAGNOSIS — M542 Cervicalgia: Secondary | ICD-10-CM | POA: Diagnosis not present

## 2019-10-12 DIAGNOSIS — M9903 Segmental and somatic dysfunction of lumbar region: Secondary | ICD-10-CM | POA: Diagnosis not present

## 2019-10-12 DIAGNOSIS — M6080 Other myositis, unspecified site: Secondary | ICD-10-CM | POA: Diagnosis not present

## 2019-10-16 DIAGNOSIS — M542 Cervicalgia: Secondary | ICD-10-CM | POA: Diagnosis not present

## 2019-10-16 DIAGNOSIS — M6080 Other myositis, unspecified site: Secondary | ICD-10-CM | POA: Diagnosis not present

## 2019-10-16 DIAGNOSIS — M9903 Segmental and somatic dysfunction of lumbar region: Secondary | ICD-10-CM | POA: Diagnosis not present

## 2019-10-16 DIAGNOSIS — M9901 Segmental and somatic dysfunction of cervical region: Secondary | ICD-10-CM | POA: Diagnosis not present

## 2019-10-18 DIAGNOSIS — M6080 Other myositis, unspecified site: Secondary | ICD-10-CM | POA: Diagnosis not present

## 2019-10-18 DIAGNOSIS — M9901 Segmental and somatic dysfunction of cervical region: Secondary | ICD-10-CM | POA: Diagnosis not present

## 2019-10-18 DIAGNOSIS — M9903 Segmental and somatic dysfunction of lumbar region: Secondary | ICD-10-CM | POA: Diagnosis not present

## 2019-10-18 DIAGNOSIS — M542 Cervicalgia: Secondary | ICD-10-CM | POA: Diagnosis not present

## 2019-11-08 DIAGNOSIS — E1165 Type 2 diabetes mellitus with hyperglycemia: Secondary | ICD-10-CM | POA: Diagnosis not present

## 2019-11-14 DIAGNOSIS — G479 Sleep disorder, unspecified: Secondary | ICD-10-CM | POA: Diagnosis not present

## 2019-11-14 DIAGNOSIS — N182 Chronic kidney disease, stage 2 (mild): Secondary | ICD-10-CM | POA: Diagnosis not present

## 2019-11-14 DIAGNOSIS — E1122 Type 2 diabetes mellitus with diabetic chronic kidney disease: Secondary | ICD-10-CM | POA: Diagnosis not present

## 2019-11-15 DIAGNOSIS — F411 Generalized anxiety disorder: Secondary | ICD-10-CM | POA: Diagnosis not present

## 2019-11-15 DIAGNOSIS — Z8719 Personal history of other diseases of the digestive system: Secondary | ICD-10-CM | POA: Diagnosis not present

## 2019-11-15 DIAGNOSIS — Z6831 Body mass index (BMI) 31.0-31.9, adult: Secondary | ICD-10-CM | POA: Diagnosis not present

## 2019-11-15 DIAGNOSIS — E1165 Type 2 diabetes mellitus with hyperglycemia: Secondary | ICD-10-CM | POA: Diagnosis not present

## 2019-12-27 DIAGNOSIS — K5100 Ulcerative (chronic) pancolitis without complications: Secondary | ICD-10-CM | POA: Diagnosis not present

## 2019-12-27 DIAGNOSIS — K219 Gastro-esophageal reflux disease without esophagitis: Secondary | ICD-10-CM | POA: Diagnosis not present

## 2019-12-27 DIAGNOSIS — R1310 Dysphagia, unspecified: Secondary | ICD-10-CM | POA: Diagnosis not present

## 2020-01-02 DIAGNOSIS — M6080 Other myositis, unspecified site: Secondary | ICD-10-CM | POA: Diagnosis not present

## 2020-01-02 DIAGNOSIS — M9901 Segmental and somatic dysfunction of cervical region: Secondary | ICD-10-CM | POA: Diagnosis not present

## 2020-01-02 DIAGNOSIS — M9903 Segmental and somatic dysfunction of lumbar region: Secondary | ICD-10-CM | POA: Diagnosis not present

## 2020-01-02 DIAGNOSIS — M542 Cervicalgia: Secondary | ICD-10-CM | POA: Diagnosis not present

## 2020-01-03 DIAGNOSIS — M9901 Segmental and somatic dysfunction of cervical region: Secondary | ICD-10-CM | POA: Diagnosis not present

## 2020-01-03 DIAGNOSIS — M542 Cervicalgia: Secondary | ICD-10-CM | POA: Diagnosis not present

## 2020-01-03 DIAGNOSIS — M9903 Segmental and somatic dysfunction of lumbar region: Secondary | ICD-10-CM | POA: Diagnosis not present

## 2020-01-03 DIAGNOSIS — M6080 Other myositis, unspecified site: Secondary | ICD-10-CM | POA: Diagnosis not present

## 2020-01-04 DIAGNOSIS — M9903 Segmental and somatic dysfunction of lumbar region: Secondary | ICD-10-CM | POA: Diagnosis not present

## 2020-01-04 DIAGNOSIS — M542 Cervicalgia: Secondary | ICD-10-CM | POA: Diagnosis not present

## 2020-01-04 DIAGNOSIS — M6080 Other myositis, unspecified site: Secondary | ICD-10-CM | POA: Diagnosis not present

## 2020-01-04 DIAGNOSIS — M9901 Segmental and somatic dysfunction of cervical region: Secondary | ICD-10-CM | POA: Diagnosis not present

## 2020-01-08 DIAGNOSIS — M6080 Other myositis, unspecified site: Secondary | ICD-10-CM | POA: Diagnosis not present

## 2020-01-08 DIAGNOSIS — M542 Cervicalgia: Secondary | ICD-10-CM | POA: Diagnosis not present

## 2020-01-08 DIAGNOSIS — M9901 Segmental and somatic dysfunction of cervical region: Secondary | ICD-10-CM | POA: Diagnosis not present

## 2020-01-08 DIAGNOSIS — M9903 Segmental and somatic dysfunction of lumbar region: Secondary | ICD-10-CM | POA: Diagnosis not present

## 2020-01-25 DIAGNOSIS — M9903 Segmental and somatic dysfunction of lumbar region: Secondary | ICD-10-CM | POA: Diagnosis not present

## 2020-01-25 DIAGNOSIS — M542 Cervicalgia: Secondary | ICD-10-CM | POA: Diagnosis not present

## 2020-01-25 DIAGNOSIS — M9901 Segmental and somatic dysfunction of cervical region: Secondary | ICD-10-CM | POA: Diagnosis not present

## 2020-01-25 DIAGNOSIS — M6080 Other myositis, unspecified site: Secondary | ICD-10-CM | POA: Diagnosis not present

## 2020-02-01 DIAGNOSIS — M9903 Segmental and somatic dysfunction of lumbar region: Secondary | ICD-10-CM | POA: Diagnosis not present

## 2020-02-01 DIAGNOSIS — M6080 Other myositis, unspecified site: Secondary | ICD-10-CM | POA: Diagnosis not present

## 2020-02-01 DIAGNOSIS — M9901 Segmental and somatic dysfunction of cervical region: Secondary | ICD-10-CM | POA: Diagnosis not present

## 2020-02-01 DIAGNOSIS — M542 Cervicalgia: Secondary | ICD-10-CM | POA: Diagnosis not present

## 2020-02-28 DIAGNOSIS — M9903 Segmental and somatic dysfunction of lumbar region: Secondary | ICD-10-CM | POA: Diagnosis not present

## 2020-02-28 DIAGNOSIS — M9901 Segmental and somatic dysfunction of cervical region: Secondary | ICD-10-CM | POA: Diagnosis not present

## 2020-02-28 DIAGNOSIS — M6080 Other myositis, unspecified site: Secondary | ICD-10-CM | POA: Diagnosis not present

## 2020-02-28 DIAGNOSIS — M542 Cervicalgia: Secondary | ICD-10-CM | POA: Diagnosis not present

## 2020-02-29 DIAGNOSIS — M542 Cervicalgia: Secondary | ICD-10-CM | POA: Diagnosis not present

## 2020-02-29 DIAGNOSIS — M9901 Segmental and somatic dysfunction of cervical region: Secondary | ICD-10-CM | POA: Diagnosis not present

## 2020-02-29 DIAGNOSIS — M6080 Other myositis, unspecified site: Secondary | ICD-10-CM | POA: Diagnosis not present

## 2020-02-29 DIAGNOSIS — M9903 Segmental and somatic dysfunction of lumbar region: Secondary | ICD-10-CM | POA: Diagnosis not present

## 2020-03-07 DIAGNOSIS — M9903 Segmental and somatic dysfunction of lumbar region: Secondary | ICD-10-CM | POA: Diagnosis not present

## 2020-03-07 DIAGNOSIS — M542 Cervicalgia: Secondary | ICD-10-CM | POA: Diagnosis not present

## 2020-03-07 DIAGNOSIS — M6080 Other myositis, unspecified site: Secondary | ICD-10-CM | POA: Diagnosis not present

## 2020-03-07 DIAGNOSIS — M9901 Segmental and somatic dysfunction of cervical region: Secondary | ICD-10-CM | POA: Diagnosis not present

## 2020-03-11 DIAGNOSIS — M9903 Segmental and somatic dysfunction of lumbar region: Secondary | ICD-10-CM | POA: Diagnosis not present

## 2020-03-11 DIAGNOSIS — M9901 Segmental and somatic dysfunction of cervical region: Secondary | ICD-10-CM | POA: Diagnosis not present

## 2020-03-11 DIAGNOSIS — M542 Cervicalgia: Secondary | ICD-10-CM | POA: Diagnosis not present

## 2020-03-11 DIAGNOSIS — M6080 Other myositis, unspecified site: Secondary | ICD-10-CM | POA: Diagnosis not present

## 2020-03-13 DIAGNOSIS — M6080 Other myositis, unspecified site: Secondary | ICD-10-CM | POA: Diagnosis not present

## 2020-03-13 DIAGNOSIS — M9901 Segmental and somatic dysfunction of cervical region: Secondary | ICD-10-CM | POA: Diagnosis not present

## 2020-03-13 DIAGNOSIS — M542 Cervicalgia: Secondary | ICD-10-CM | POA: Diagnosis not present

## 2020-03-13 DIAGNOSIS — M9903 Segmental and somatic dysfunction of lumbar region: Secondary | ICD-10-CM | POA: Diagnosis not present

## 2020-03-19 DIAGNOSIS — M9903 Segmental and somatic dysfunction of lumbar region: Secondary | ICD-10-CM | POA: Diagnosis not present

## 2020-03-19 DIAGNOSIS — M9901 Segmental and somatic dysfunction of cervical region: Secondary | ICD-10-CM | POA: Diagnosis not present

## 2020-03-19 DIAGNOSIS — M542 Cervicalgia: Secondary | ICD-10-CM | POA: Diagnosis not present

## 2020-03-19 DIAGNOSIS — M6080 Other myositis, unspecified site: Secondary | ICD-10-CM | POA: Diagnosis not present

## 2020-03-27 DIAGNOSIS — M9901 Segmental and somatic dysfunction of cervical region: Secondary | ICD-10-CM | POA: Diagnosis not present

## 2020-03-27 DIAGNOSIS — M542 Cervicalgia: Secondary | ICD-10-CM | POA: Diagnosis not present

## 2020-03-27 DIAGNOSIS — M9903 Segmental and somatic dysfunction of lumbar region: Secondary | ICD-10-CM | POA: Diagnosis not present

## 2020-03-27 DIAGNOSIS — M6080 Other myositis, unspecified site: Secondary | ICD-10-CM | POA: Diagnosis not present

## 2020-03-28 DIAGNOSIS — M542 Cervicalgia: Secondary | ICD-10-CM | POA: Diagnosis not present

## 2020-03-28 DIAGNOSIS — M9901 Segmental and somatic dysfunction of cervical region: Secondary | ICD-10-CM | POA: Diagnosis not present

## 2020-03-28 DIAGNOSIS — M9903 Segmental and somatic dysfunction of lumbar region: Secondary | ICD-10-CM | POA: Diagnosis not present

## 2020-03-28 DIAGNOSIS — M6080 Other myositis, unspecified site: Secondary | ICD-10-CM | POA: Diagnosis not present

## 2020-04-01 DIAGNOSIS — M9903 Segmental and somatic dysfunction of lumbar region: Secondary | ICD-10-CM | POA: Diagnosis not present

## 2020-04-01 DIAGNOSIS — M542 Cervicalgia: Secondary | ICD-10-CM | POA: Diagnosis not present

## 2020-04-01 DIAGNOSIS — M6080 Other myositis, unspecified site: Secondary | ICD-10-CM | POA: Diagnosis not present

## 2020-04-01 DIAGNOSIS — M9901 Segmental and somatic dysfunction of cervical region: Secondary | ICD-10-CM | POA: Diagnosis not present

## 2020-04-02 DIAGNOSIS — M6080 Other myositis, unspecified site: Secondary | ICD-10-CM | POA: Diagnosis not present

## 2020-04-02 DIAGNOSIS — M9901 Segmental and somatic dysfunction of cervical region: Secondary | ICD-10-CM | POA: Diagnosis not present

## 2020-04-02 DIAGNOSIS — M9903 Segmental and somatic dysfunction of lumbar region: Secondary | ICD-10-CM | POA: Diagnosis not present

## 2020-04-02 DIAGNOSIS — M542 Cervicalgia: Secondary | ICD-10-CM | POA: Diagnosis not present

## 2020-04-03 DIAGNOSIS — M9901 Segmental and somatic dysfunction of cervical region: Secondary | ICD-10-CM | POA: Diagnosis not present

## 2020-04-03 DIAGNOSIS — M9903 Segmental and somatic dysfunction of lumbar region: Secondary | ICD-10-CM | POA: Diagnosis not present

## 2020-04-03 DIAGNOSIS — M542 Cervicalgia: Secondary | ICD-10-CM | POA: Diagnosis not present

## 2020-04-03 DIAGNOSIS — M6080 Other myositis, unspecified site: Secondary | ICD-10-CM | POA: Diagnosis not present

## 2020-05-01 DIAGNOSIS — M542 Cervicalgia: Secondary | ICD-10-CM | POA: Diagnosis not present

## 2020-05-01 DIAGNOSIS — M9903 Segmental and somatic dysfunction of lumbar region: Secondary | ICD-10-CM | POA: Diagnosis not present

## 2020-05-01 DIAGNOSIS — M9901 Segmental and somatic dysfunction of cervical region: Secondary | ICD-10-CM | POA: Diagnosis not present

## 2020-05-01 DIAGNOSIS — M6080 Other myositis, unspecified site: Secondary | ICD-10-CM | POA: Diagnosis not present

## 2020-05-02 DIAGNOSIS — M9903 Segmental and somatic dysfunction of lumbar region: Secondary | ICD-10-CM | POA: Diagnosis not present

## 2020-05-02 DIAGNOSIS — M9901 Segmental and somatic dysfunction of cervical region: Secondary | ICD-10-CM | POA: Diagnosis not present

## 2020-05-02 DIAGNOSIS — M542 Cervicalgia: Secondary | ICD-10-CM | POA: Diagnosis not present

## 2020-05-02 DIAGNOSIS — M6080 Other myositis, unspecified site: Secondary | ICD-10-CM | POA: Diagnosis not present

## 2020-05-06 DIAGNOSIS — M9901 Segmental and somatic dysfunction of cervical region: Secondary | ICD-10-CM | POA: Diagnosis not present

## 2020-05-06 DIAGNOSIS — M542 Cervicalgia: Secondary | ICD-10-CM | POA: Diagnosis not present

## 2020-05-06 DIAGNOSIS — M6080 Other myositis, unspecified site: Secondary | ICD-10-CM | POA: Diagnosis not present

## 2020-05-06 DIAGNOSIS — M9903 Segmental and somatic dysfunction of lumbar region: Secondary | ICD-10-CM | POA: Diagnosis not present

## 2020-05-08 DIAGNOSIS — M6080 Other myositis, unspecified site: Secondary | ICD-10-CM | POA: Diagnosis not present

## 2020-05-08 DIAGNOSIS — M542 Cervicalgia: Secondary | ICD-10-CM | POA: Diagnosis not present

## 2020-05-08 DIAGNOSIS — M9903 Segmental and somatic dysfunction of lumbar region: Secondary | ICD-10-CM | POA: Diagnosis not present

## 2020-05-08 DIAGNOSIS — M9901 Segmental and somatic dysfunction of cervical region: Secondary | ICD-10-CM | POA: Diagnosis not present

## 2020-05-09 DIAGNOSIS — M6080 Other myositis, unspecified site: Secondary | ICD-10-CM | POA: Diagnosis not present

## 2020-05-09 DIAGNOSIS — M542 Cervicalgia: Secondary | ICD-10-CM | POA: Diagnosis not present

## 2020-05-09 DIAGNOSIS — M9903 Segmental and somatic dysfunction of lumbar region: Secondary | ICD-10-CM | POA: Diagnosis not present

## 2020-05-09 DIAGNOSIS — M9901 Segmental and somatic dysfunction of cervical region: Secondary | ICD-10-CM | POA: Diagnosis not present

## 2020-05-16 DIAGNOSIS — Z8719 Personal history of other diseases of the digestive system: Secondary | ICD-10-CM | POA: Diagnosis not present

## 2020-05-16 DIAGNOSIS — E1122 Type 2 diabetes mellitus with diabetic chronic kidney disease: Secondary | ICD-10-CM | POA: Diagnosis not present

## 2020-05-16 DIAGNOSIS — Z0000 Encounter for general adult medical examination without abnormal findings: Secondary | ICD-10-CM | POA: Diagnosis not present

## 2020-05-16 DIAGNOSIS — Z79899 Other long term (current) drug therapy: Secondary | ICD-10-CM | POA: Diagnosis not present

## 2020-05-16 DIAGNOSIS — E1165 Type 2 diabetes mellitus with hyperglycemia: Secondary | ICD-10-CM | POA: Diagnosis not present

## 2020-05-16 DIAGNOSIS — E559 Vitamin D deficiency, unspecified: Secondary | ICD-10-CM | POA: Diagnosis not present

## 2020-05-16 DIAGNOSIS — E782 Mixed hyperlipidemia: Secondary | ICD-10-CM | POA: Diagnosis not present

## 2020-05-16 DIAGNOSIS — Z23 Encounter for immunization: Secondary | ICD-10-CM | POA: Diagnosis not present

## 2020-05-20 DIAGNOSIS — E1165 Type 2 diabetes mellitus with hyperglycemia: Secondary | ICD-10-CM | POA: Diagnosis not present

## 2020-05-20 DIAGNOSIS — F411 Generalized anxiety disorder: Secondary | ICD-10-CM | POA: Diagnosis not present

## 2020-05-20 DIAGNOSIS — E785 Hyperlipidemia, unspecified: Secondary | ICD-10-CM | POA: Diagnosis not present

## 2020-05-20 DIAGNOSIS — Z8719 Personal history of other diseases of the digestive system: Secondary | ICD-10-CM | POA: Diagnosis not present

## 2020-05-27 DIAGNOSIS — M542 Cervicalgia: Secondary | ICD-10-CM | POA: Diagnosis not present

## 2020-05-27 DIAGNOSIS — M9901 Segmental and somatic dysfunction of cervical region: Secondary | ICD-10-CM | POA: Diagnosis not present

## 2020-05-27 DIAGNOSIS — M6080 Other myositis, unspecified site: Secondary | ICD-10-CM | POA: Diagnosis not present

## 2020-05-27 DIAGNOSIS — M9903 Segmental and somatic dysfunction of lumbar region: Secondary | ICD-10-CM | POA: Diagnosis not present

## 2020-05-30 ENCOUNTER — Other Ambulatory Visit (HOSPITAL_COMMUNITY): Payer: Self-pay | Admitting: Family Medicine

## 2020-05-30 DIAGNOSIS — Q248 Other specified congenital malformations of heart: Secondary | ICD-10-CM

## 2020-05-30 DIAGNOSIS — M9903 Segmental and somatic dysfunction of lumbar region: Secondary | ICD-10-CM | POA: Diagnosis not present

## 2020-05-30 DIAGNOSIS — M9901 Segmental and somatic dysfunction of cervical region: Secondary | ICD-10-CM | POA: Diagnosis not present

## 2020-05-30 DIAGNOSIS — M6080 Other myositis, unspecified site: Secondary | ICD-10-CM | POA: Diagnosis not present

## 2020-05-30 DIAGNOSIS — M542 Cervicalgia: Secondary | ICD-10-CM | POA: Diagnosis not present

## 2020-05-31 ENCOUNTER — Ambulatory Visit (HOSPITAL_COMMUNITY): Payer: BC Managed Care – PPO | Attending: Internal Medicine

## 2020-05-31 ENCOUNTER — Other Ambulatory Visit: Payer: Self-pay

## 2020-05-31 DIAGNOSIS — Q248 Other specified congenital malformations of heart: Secondary | ICD-10-CM | POA: Diagnosis not present

## 2020-05-31 LAB — ECHOCARDIOGRAM COMPLETE
Area-P 1/2: 4 cm2
S' Lateral: 2.6 cm

## 2020-06-17 DIAGNOSIS — M9903 Segmental and somatic dysfunction of lumbar region: Secondary | ICD-10-CM | POA: Diagnosis not present

## 2020-06-17 DIAGNOSIS — M542 Cervicalgia: Secondary | ICD-10-CM | POA: Diagnosis not present

## 2020-06-17 DIAGNOSIS — M9901 Segmental and somatic dysfunction of cervical region: Secondary | ICD-10-CM | POA: Diagnosis not present

## 2020-06-17 DIAGNOSIS — M6080 Other myositis, unspecified site: Secondary | ICD-10-CM | POA: Diagnosis not present

## 2020-06-19 DIAGNOSIS — M6080 Other myositis, unspecified site: Secondary | ICD-10-CM | POA: Diagnosis not present

## 2020-06-19 DIAGNOSIS — M542 Cervicalgia: Secondary | ICD-10-CM | POA: Diagnosis not present

## 2020-06-19 DIAGNOSIS — M9901 Segmental and somatic dysfunction of cervical region: Secondary | ICD-10-CM | POA: Diagnosis not present

## 2020-06-19 DIAGNOSIS — M9903 Segmental and somatic dysfunction of lumbar region: Secondary | ICD-10-CM | POA: Diagnosis not present

## 2020-06-24 DIAGNOSIS — M6080 Other myositis, unspecified site: Secondary | ICD-10-CM | POA: Diagnosis not present

## 2020-06-24 DIAGNOSIS — M9901 Segmental and somatic dysfunction of cervical region: Secondary | ICD-10-CM | POA: Diagnosis not present

## 2020-06-24 DIAGNOSIS — M9903 Segmental and somatic dysfunction of lumbar region: Secondary | ICD-10-CM | POA: Diagnosis not present

## 2020-06-24 DIAGNOSIS — M542 Cervicalgia: Secondary | ICD-10-CM | POA: Diagnosis not present

## 2020-06-27 DIAGNOSIS — M9903 Segmental and somatic dysfunction of lumbar region: Secondary | ICD-10-CM | POA: Diagnosis not present

## 2020-06-27 DIAGNOSIS — M9901 Segmental and somatic dysfunction of cervical region: Secondary | ICD-10-CM | POA: Diagnosis not present

## 2020-06-27 DIAGNOSIS — M6080 Other myositis, unspecified site: Secondary | ICD-10-CM | POA: Diagnosis not present

## 2020-06-27 DIAGNOSIS — M542 Cervicalgia: Secondary | ICD-10-CM | POA: Diagnosis not present

## 2020-07-01 DIAGNOSIS — M9903 Segmental and somatic dysfunction of lumbar region: Secondary | ICD-10-CM | POA: Diagnosis not present

## 2020-07-01 DIAGNOSIS — M542 Cervicalgia: Secondary | ICD-10-CM | POA: Diagnosis not present

## 2020-07-01 DIAGNOSIS — M9901 Segmental and somatic dysfunction of cervical region: Secondary | ICD-10-CM | POA: Diagnosis not present

## 2020-07-01 DIAGNOSIS — M6080 Other myositis, unspecified site: Secondary | ICD-10-CM | POA: Diagnosis not present

## 2020-07-03 DIAGNOSIS — M542 Cervicalgia: Secondary | ICD-10-CM | POA: Diagnosis not present

## 2020-07-03 DIAGNOSIS — M6080 Other myositis, unspecified site: Secondary | ICD-10-CM | POA: Diagnosis not present

## 2020-07-03 DIAGNOSIS — M9903 Segmental and somatic dysfunction of lumbar region: Secondary | ICD-10-CM | POA: Diagnosis not present

## 2020-07-03 DIAGNOSIS — M9901 Segmental and somatic dysfunction of cervical region: Secondary | ICD-10-CM | POA: Diagnosis not present

## 2020-07-04 DIAGNOSIS — M9901 Segmental and somatic dysfunction of cervical region: Secondary | ICD-10-CM | POA: Diagnosis not present

## 2020-07-04 DIAGNOSIS — M6080 Other myositis, unspecified site: Secondary | ICD-10-CM | POA: Diagnosis not present

## 2020-07-04 DIAGNOSIS — M542 Cervicalgia: Secondary | ICD-10-CM | POA: Diagnosis not present

## 2020-07-04 DIAGNOSIS — M9903 Segmental and somatic dysfunction of lumbar region: Secondary | ICD-10-CM | POA: Diagnosis not present

## 2020-07-24 DIAGNOSIS — Z124 Encounter for screening for malignant neoplasm of cervix: Secondary | ICD-10-CM | POA: Diagnosis not present

## 2020-07-24 DIAGNOSIS — Z01419 Encounter for gynecological examination (general) (routine) without abnormal findings: Secondary | ICD-10-CM | POA: Diagnosis not present

## 2020-07-30 DIAGNOSIS — M6080 Other myositis, unspecified site: Secondary | ICD-10-CM | POA: Diagnosis not present

## 2020-07-30 DIAGNOSIS — M9901 Segmental and somatic dysfunction of cervical region: Secondary | ICD-10-CM | POA: Diagnosis not present

## 2020-07-30 DIAGNOSIS — M9903 Segmental and somatic dysfunction of lumbar region: Secondary | ICD-10-CM | POA: Diagnosis not present

## 2020-07-30 DIAGNOSIS — M542 Cervicalgia: Secondary | ICD-10-CM | POA: Diagnosis not present

## 2020-07-31 DIAGNOSIS — M9903 Segmental and somatic dysfunction of lumbar region: Secondary | ICD-10-CM | POA: Diagnosis not present

## 2020-07-31 DIAGNOSIS — M9901 Segmental and somatic dysfunction of cervical region: Secondary | ICD-10-CM | POA: Diagnosis not present

## 2020-07-31 DIAGNOSIS — M6080 Other myositis, unspecified site: Secondary | ICD-10-CM | POA: Diagnosis not present

## 2020-07-31 DIAGNOSIS — M542 Cervicalgia: Secondary | ICD-10-CM | POA: Diagnosis not present

## 2020-08-01 DIAGNOSIS — M6080 Other myositis, unspecified site: Secondary | ICD-10-CM | POA: Diagnosis not present

## 2020-08-01 DIAGNOSIS — M9901 Segmental and somatic dysfunction of cervical region: Secondary | ICD-10-CM | POA: Diagnosis not present

## 2020-08-01 DIAGNOSIS — M9903 Segmental and somatic dysfunction of lumbar region: Secondary | ICD-10-CM | POA: Diagnosis not present

## 2020-08-01 DIAGNOSIS — M542 Cervicalgia: Secondary | ICD-10-CM | POA: Diagnosis not present

## 2020-08-05 DIAGNOSIS — M9901 Segmental and somatic dysfunction of cervical region: Secondary | ICD-10-CM | POA: Diagnosis not present

## 2020-08-05 DIAGNOSIS — M6080 Other myositis, unspecified site: Secondary | ICD-10-CM | POA: Diagnosis not present

## 2020-08-05 DIAGNOSIS — M9903 Segmental and somatic dysfunction of lumbar region: Secondary | ICD-10-CM | POA: Diagnosis not present

## 2020-08-05 DIAGNOSIS — M542 Cervicalgia: Secondary | ICD-10-CM | POA: Diagnosis not present

## 2020-08-06 ENCOUNTER — Other Ambulatory Visit (HOSPITAL_COMMUNITY): Payer: Self-pay | Admitting: Family Medicine

## 2020-08-06 DIAGNOSIS — G453 Amaurosis fugax: Secondary | ICD-10-CM

## 2020-08-07 ENCOUNTER — Other Ambulatory Visit: Payer: Self-pay

## 2020-08-07 ENCOUNTER — Ambulatory Visit (HOSPITAL_COMMUNITY)
Admission: RE | Admit: 2020-08-07 | Discharge: 2020-08-07 | Disposition: A | Discharge status: Home / Self Care | Payer: BC Managed Care – PPO | Source: Ambulatory Visit | Attending: Cardiology | Admitting: Cardiology

## 2020-08-07 ENCOUNTER — Other Ambulatory Visit (HOSPITAL_COMMUNITY): Payer: Self-pay | Admitting: Family Medicine

## 2020-08-07 DIAGNOSIS — G453 Amaurosis fugax: Secondary | ICD-10-CM | POA: Diagnosis not present

## 2020-08-08 DIAGNOSIS — M9901 Segmental and somatic dysfunction of cervical region: Secondary | ICD-10-CM | POA: Diagnosis not present

## 2020-08-08 DIAGNOSIS — M542 Cervicalgia: Secondary | ICD-10-CM | POA: Diagnosis not present

## 2020-08-08 DIAGNOSIS — M9903 Segmental and somatic dysfunction of lumbar region: Secondary | ICD-10-CM | POA: Diagnosis not present

## 2020-08-08 DIAGNOSIS — M6080 Other myositis, unspecified site: Secondary | ICD-10-CM | POA: Diagnosis not present

## 2020-08-13 DIAGNOSIS — M6080 Other myositis, unspecified site: Secondary | ICD-10-CM | POA: Diagnosis not present

## 2020-08-13 DIAGNOSIS — M9903 Segmental and somatic dysfunction of lumbar region: Secondary | ICD-10-CM | POA: Diagnosis not present

## 2020-08-13 DIAGNOSIS — M542 Cervicalgia: Secondary | ICD-10-CM | POA: Diagnosis not present

## 2020-08-13 DIAGNOSIS — M9901 Segmental and somatic dysfunction of cervical region: Secondary | ICD-10-CM | POA: Diagnosis not present

## 2020-08-14 DIAGNOSIS — M542 Cervicalgia: Secondary | ICD-10-CM | POA: Diagnosis not present

## 2020-08-14 DIAGNOSIS — M9903 Segmental and somatic dysfunction of lumbar region: Secondary | ICD-10-CM | POA: Diagnosis not present

## 2020-08-14 DIAGNOSIS — M9901 Segmental and somatic dysfunction of cervical region: Secondary | ICD-10-CM | POA: Diagnosis not present

## 2020-08-14 DIAGNOSIS — M6080 Other myositis, unspecified site: Secondary | ICD-10-CM | POA: Diagnosis not present

## 2020-08-21 DIAGNOSIS — M542 Cervicalgia: Secondary | ICD-10-CM | POA: Diagnosis not present

## 2020-08-21 DIAGNOSIS — M6080 Other myositis, unspecified site: Secondary | ICD-10-CM | POA: Diagnosis not present

## 2020-08-21 DIAGNOSIS — M9903 Segmental and somatic dysfunction of lumbar region: Secondary | ICD-10-CM | POA: Diagnosis not present

## 2020-08-21 DIAGNOSIS — M9901 Segmental and somatic dysfunction of cervical region: Secondary | ICD-10-CM | POA: Diagnosis not present

## 2020-08-22 DIAGNOSIS — M9901 Segmental and somatic dysfunction of cervical region: Secondary | ICD-10-CM | POA: Diagnosis not present

## 2020-08-22 DIAGNOSIS — M9903 Segmental and somatic dysfunction of lumbar region: Secondary | ICD-10-CM | POA: Diagnosis not present

## 2020-08-22 DIAGNOSIS — M6080 Other myositis, unspecified site: Secondary | ICD-10-CM | POA: Diagnosis not present

## 2020-08-22 DIAGNOSIS — M542 Cervicalgia: Secondary | ICD-10-CM | POA: Diagnosis not present

## 2020-08-26 DIAGNOSIS — M542 Cervicalgia: Secondary | ICD-10-CM | POA: Diagnosis not present

## 2020-08-26 DIAGNOSIS — M9901 Segmental and somatic dysfunction of cervical region: Secondary | ICD-10-CM | POA: Diagnosis not present

## 2020-08-26 DIAGNOSIS — M9903 Segmental and somatic dysfunction of lumbar region: Secondary | ICD-10-CM | POA: Diagnosis not present

## 2020-08-26 DIAGNOSIS — M6080 Other myositis, unspecified site: Secondary | ICD-10-CM | POA: Diagnosis not present

## 2020-08-28 DIAGNOSIS — M542 Cervicalgia: Secondary | ICD-10-CM | POA: Diagnosis not present

## 2020-08-28 DIAGNOSIS — M6080 Other myositis, unspecified site: Secondary | ICD-10-CM | POA: Diagnosis not present

## 2020-08-28 DIAGNOSIS — M9903 Segmental and somatic dysfunction of lumbar region: Secondary | ICD-10-CM | POA: Diagnosis not present

## 2020-08-28 DIAGNOSIS — M9901 Segmental and somatic dysfunction of cervical region: Secondary | ICD-10-CM | POA: Diagnosis not present

## 2020-08-29 DIAGNOSIS — M6080 Other myositis, unspecified site: Secondary | ICD-10-CM | POA: Diagnosis not present

## 2020-08-29 DIAGNOSIS — M542 Cervicalgia: Secondary | ICD-10-CM | POA: Diagnosis not present

## 2020-08-29 DIAGNOSIS — M9903 Segmental and somatic dysfunction of lumbar region: Secondary | ICD-10-CM | POA: Diagnosis not present

## 2020-08-29 DIAGNOSIS — M9901 Segmental and somatic dysfunction of cervical region: Secondary | ICD-10-CM | POA: Diagnosis not present

## 2020-09-02 DIAGNOSIS — M6080 Other myositis, unspecified site: Secondary | ICD-10-CM | POA: Diagnosis not present

## 2020-09-02 DIAGNOSIS — M9901 Segmental and somatic dysfunction of cervical region: Secondary | ICD-10-CM | POA: Diagnosis not present

## 2020-09-02 DIAGNOSIS — M9903 Segmental and somatic dysfunction of lumbar region: Secondary | ICD-10-CM | POA: Diagnosis not present

## 2020-09-02 DIAGNOSIS — M542 Cervicalgia: Secondary | ICD-10-CM | POA: Diagnosis not present

## 2020-09-05 DIAGNOSIS — M9903 Segmental and somatic dysfunction of lumbar region: Secondary | ICD-10-CM | POA: Diagnosis not present

## 2020-09-05 DIAGNOSIS — M542 Cervicalgia: Secondary | ICD-10-CM | POA: Diagnosis not present

## 2020-09-05 DIAGNOSIS — M6080 Other myositis, unspecified site: Secondary | ICD-10-CM | POA: Diagnosis not present

## 2020-09-05 DIAGNOSIS — M9901 Segmental and somatic dysfunction of cervical region: Secondary | ICD-10-CM | POA: Diagnosis not present

## 2020-09-16 DIAGNOSIS — M542 Cervicalgia: Secondary | ICD-10-CM | POA: Diagnosis not present

## 2020-09-16 DIAGNOSIS — M6080 Other myositis, unspecified site: Secondary | ICD-10-CM | POA: Diagnosis not present

## 2020-09-16 DIAGNOSIS — M9901 Segmental and somatic dysfunction of cervical region: Secondary | ICD-10-CM | POA: Diagnosis not present

## 2020-09-16 DIAGNOSIS — M9903 Segmental and somatic dysfunction of lumbar region: Secondary | ICD-10-CM | POA: Diagnosis not present

## 2020-09-17 DIAGNOSIS — M9901 Segmental and somatic dysfunction of cervical region: Secondary | ICD-10-CM | POA: Diagnosis not present

## 2020-09-17 DIAGNOSIS — M6080 Other myositis, unspecified site: Secondary | ICD-10-CM | POA: Diagnosis not present

## 2020-09-17 DIAGNOSIS — M542 Cervicalgia: Secondary | ICD-10-CM | POA: Diagnosis not present

## 2020-09-17 DIAGNOSIS — M9903 Segmental and somatic dysfunction of lumbar region: Secondary | ICD-10-CM | POA: Diagnosis not present

## 2020-09-18 DIAGNOSIS — M9903 Segmental and somatic dysfunction of lumbar region: Secondary | ICD-10-CM | POA: Diagnosis not present

## 2020-09-18 DIAGNOSIS — M542 Cervicalgia: Secondary | ICD-10-CM | POA: Diagnosis not present

## 2020-09-18 DIAGNOSIS — M6080 Other myositis, unspecified site: Secondary | ICD-10-CM | POA: Diagnosis not present

## 2020-09-18 DIAGNOSIS — M9901 Segmental and somatic dysfunction of cervical region: Secondary | ICD-10-CM | POA: Diagnosis not present

## 2020-09-19 DIAGNOSIS — M6080 Other myositis, unspecified site: Secondary | ICD-10-CM | POA: Diagnosis not present

## 2020-09-19 DIAGNOSIS — M542 Cervicalgia: Secondary | ICD-10-CM | POA: Diagnosis not present

## 2020-09-19 DIAGNOSIS — M9903 Segmental and somatic dysfunction of lumbar region: Secondary | ICD-10-CM | POA: Diagnosis not present

## 2020-09-19 DIAGNOSIS — M9901 Segmental and somatic dysfunction of cervical region: Secondary | ICD-10-CM | POA: Diagnosis not present

## 2020-09-24 DIAGNOSIS — M9903 Segmental and somatic dysfunction of lumbar region: Secondary | ICD-10-CM | POA: Diagnosis not present

## 2020-09-24 DIAGNOSIS — M6080 Other myositis, unspecified site: Secondary | ICD-10-CM | POA: Diagnosis not present

## 2020-09-24 DIAGNOSIS — M9901 Segmental and somatic dysfunction of cervical region: Secondary | ICD-10-CM | POA: Diagnosis not present

## 2020-09-24 DIAGNOSIS — M542 Cervicalgia: Secondary | ICD-10-CM | POA: Diagnosis not present

## 2020-09-26 DIAGNOSIS — M9903 Segmental and somatic dysfunction of lumbar region: Secondary | ICD-10-CM | POA: Diagnosis not present

## 2020-09-26 DIAGNOSIS — M542 Cervicalgia: Secondary | ICD-10-CM | POA: Diagnosis not present

## 2020-09-26 DIAGNOSIS — M6080 Other myositis, unspecified site: Secondary | ICD-10-CM | POA: Diagnosis not present

## 2020-09-26 DIAGNOSIS — M9901 Segmental and somatic dysfunction of cervical region: Secondary | ICD-10-CM | POA: Diagnosis not present

## 2020-10-07 DIAGNOSIS — M6080 Other myositis, unspecified site: Secondary | ICD-10-CM | POA: Diagnosis not present

## 2020-10-07 DIAGNOSIS — M9903 Segmental and somatic dysfunction of lumbar region: Secondary | ICD-10-CM | POA: Diagnosis not present

## 2020-10-07 DIAGNOSIS — M542 Cervicalgia: Secondary | ICD-10-CM | POA: Diagnosis not present

## 2020-10-07 DIAGNOSIS — M9901 Segmental and somatic dysfunction of cervical region: Secondary | ICD-10-CM | POA: Diagnosis not present

## 2020-10-14 DIAGNOSIS — M542 Cervicalgia: Secondary | ICD-10-CM | POA: Diagnosis not present

## 2020-10-14 DIAGNOSIS — M6080 Other myositis, unspecified site: Secondary | ICD-10-CM | POA: Diagnosis not present

## 2020-10-14 DIAGNOSIS — M9903 Segmental and somatic dysfunction of lumbar region: Secondary | ICD-10-CM | POA: Diagnosis not present

## 2020-10-14 DIAGNOSIS — M9901 Segmental and somatic dysfunction of cervical region: Secondary | ICD-10-CM | POA: Diagnosis not present

## 2020-11-04 DIAGNOSIS — Z720 Tobacco use: Secondary | ICD-10-CM | POA: Diagnosis not present

## 2020-11-04 DIAGNOSIS — K5100 Ulcerative (chronic) pancolitis without complications: Secondary | ICD-10-CM | POA: Diagnosis not present

## 2020-11-11 DIAGNOSIS — E1165 Type 2 diabetes mellitus with hyperglycemia: Secondary | ICD-10-CM | POA: Diagnosis not present

## 2020-11-18 DIAGNOSIS — E785 Hyperlipidemia, unspecified: Secondary | ICD-10-CM | POA: Diagnosis not present

## 2020-11-18 DIAGNOSIS — Z8719 Personal history of other diseases of the digestive system: Secondary | ICD-10-CM | POA: Diagnosis not present

## 2020-11-18 DIAGNOSIS — E1165 Type 2 diabetes mellitus with hyperglycemia: Secondary | ICD-10-CM | POA: Diagnosis not present

## 2020-11-18 DIAGNOSIS — F411 Generalized anxiety disorder: Secondary | ICD-10-CM | POA: Diagnosis not present

## 2020-12-24 DIAGNOSIS — I10 Essential (primary) hypertension: Secondary | ICD-10-CM | POA: Diagnosis not present

## 2020-12-24 DIAGNOSIS — B009 Herpesviral infection, unspecified: Secondary | ICD-10-CM | POA: Diagnosis not present

## 2020-12-24 DIAGNOSIS — G479 Sleep disorder, unspecified: Secondary | ICD-10-CM | POA: Diagnosis not present

## 2020-12-24 DIAGNOSIS — L299 Pruritus, unspecified: Secondary | ICD-10-CM | POA: Diagnosis not present

## 2021-04-29 DIAGNOSIS — M542 Cervicalgia: Secondary | ICD-10-CM | POA: Diagnosis not present

## 2021-04-29 DIAGNOSIS — M9901 Segmental and somatic dysfunction of cervical region: Secondary | ICD-10-CM | POA: Diagnosis not present

## 2021-04-29 DIAGNOSIS — M6080 Other myositis, unspecified site: Secondary | ICD-10-CM | POA: Diagnosis not present

## 2021-04-29 DIAGNOSIS — M9903 Segmental and somatic dysfunction of lumbar region: Secondary | ICD-10-CM | POA: Diagnosis not present

## 2021-04-30 DIAGNOSIS — M6080 Other myositis, unspecified site: Secondary | ICD-10-CM | POA: Diagnosis not present

## 2021-04-30 DIAGNOSIS — M9901 Segmental and somatic dysfunction of cervical region: Secondary | ICD-10-CM | POA: Diagnosis not present

## 2021-04-30 DIAGNOSIS — M542 Cervicalgia: Secondary | ICD-10-CM | POA: Diagnosis not present

## 2021-04-30 DIAGNOSIS — M9903 Segmental and somatic dysfunction of lumbar region: Secondary | ICD-10-CM | POA: Diagnosis not present

## 2021-05-05 DIAGNOSIS — M542 Cervicalgia: Secondary | ICD-10-CM | POA: Diagnosis not present

## 2021-05-05 DIAGNOSIS — M9903 Segmental and somatic dysfunction of lumbar region: Secondary | ICD-10-CM | POA: Diagnosis not present

## 2021-05-05 DIAGNOSIS — M6080 Other myositis, unspecified site: Secondary | ICD-10-CM | POA: Diagnosis not present

## 2021-05-05 DIAGNOSIS — M9901 Segmental and somatic dysfunction of cervical region: Secondary | ICD-10-CM | POA: Diagnosis not present

## 2021-05-07 DIAGNOSIS — M542 Cervicalgia: Secondary | ICD-10-CM | POA: Diagnosis not present

## 2021-05-07 DIAGNOSIS — M6080 Other myositis, unspecified site: Secondary | ICD-10-CM | POA: Diagnosis not present

## 2021-05-07 DIAGNOSIS — M9901 Segmental and somatic dysfunction of cervical region: Secondary | ICD-10-CM | POA: Diagnosis not present

## 2021-05-07 DIAGNOSIS — M9903 Segmental and somatic dysfunction of lumbar region: Secondary | ICD-10-CM | POA: Diagnosis not present

## 2021-05-12 DIAGNOSIS — M6080 Other myositis, unspecified site: Secondary | ICD-10-CM | POA: Diagnosis not present

## 2021-05-12 DIAGNOSIS — M9901 Segmental and somatic dysfunction of cervical region: Secondary | ICD-10-CM | POA: Diagnosis not present

## 2021-05-12 DIAGNOSIS — M542 Cervicalgia: Secondary | ICD-10-CM | POA: Diagnosis not present

## 2021-05-12 DIAGNOSIS — M9903 Segmental and somatic dysfunction of lumbar region: Secondary | ICD-10-CM | POA: Diagnosis not present

## 2021-05-19 DIAGNOSIS — E785 Hyperlipidemia, unspecified: Secondary | ICD-10-CM | POA: Diagnosis not present

## 2021-05-19 DIAGNOSIS — E1165 Type 2 diabetes mellitus with hyperglycemia: Secondary | ICD-10-CM | POA: Diagnosis not present

## 2021-05-20 DIAGNOSIS — E559 Vitamin D deficiency, unspecified: Secondary | ICD-10-CM | POA: Diagnosis not present

## 2021-05-20 DIAGNOSIS — Z79899 Other long term (current) drug therapy: Secondary | ICD-10-CM | POA: Diagnosis not present

## 2021-05-20 DIAGNOSIS — Z0000 Encounter for general adult medical examination without abnormal findings: Secondary | ICD-10-CM | POA: Diagnosis not present

## 2021-05-20 DIAGNOSIS — I10 Essential (primary) hypertension: Secondary | ICD-10-CM | POA: Diagnosis not present

## 2021-05-26 DIAGNOSIS — F411 Generalized anxiety disorder: Secondary | ICD-10-CM | POA: Diagnosis not present

## 2021-05-26 DIAGNOSIS — M9903 Segmental and somatic dysfunction of lumbar region: Secondary | ICD-10-CM | POA: Diagnosis not present

## 2021-05-26 DIAGNOSIS — M542 Cervicalgia: Secondary | ICD-10-CM | POA: Diagnosis not present

## 2021-05-26 DIAGNOSIS — E1165 Type 2 diabetes mellitus with hyperglycemia: Secondary | ICD-10-CM | POA: Diagnosis not present

## 2021-05-26 DIAGNOSIS — M6080 Other myositis, unspecified site: Secondary | ICD-10-CM | POA: Diagnosis not present

## 2021-05-26 DIAGNOSIS — Z8719 Personal history of other diseases of the digestive system: Secondary | ICD-10-CM | POA: Diagnosis not present

## 2021-05-26 DIAGNOSIS — E785 Hyperlipidemia, unspecified: Secondary | ICD-10-CM | POA: Diagnosis not present

## 2021-05-26 DIAGNOSIS — M9901 Segmental and somatic dysfunction of cervical region: Secondary | ICD-10-CM | POA: Diagnosis not present

## 2021-05-27 DIAGNOSIS — M6080 Other myositis, unspecified site: Secondary | ICD-10-CM | POA: Diagnosis not present

## 2021-05-27 DIAGNOSIS — M9903 Segmental and somatic dysfunction of lumbar region: Secondary | ICD-10-CM | POA: Diagnosis not present

## 2021-05-27 DIAGNOSIS — M9901 Segmental and somatic dysfunction of cervical region: Secondary | ICD-10-CM | POA: Diagnosis not present

## 2021-05-27 DIAGNOSIS — M542 Cervicalgia: Secondary | ICD-10-CM | POA: Diagnosis not present

## 2021-07-30 DIAGNOSIS — M6080 Other myositis, unspecified site: Secondary | ICD-10-CM | POA: Diagnosis not present

## 2021-07-30 DIAGNOSIS — M542 Cervicalgia: Secondary | ICD-10-CM | POA: Diagnosis not present

## 2021-07-30 DIAGNOSIS — M9903 Segmental and somatic dysfunction of lumbar region: Secondary | ICD-10-CM | POA: Diagnosis not present

## 2021-07-30 DIAGNOSIS — M9901 Segmental and somatic dysfunction of cervical region: Secondary | ICD-10-CM | POA: Diagnosis not present

## 2021-08-19 DIAGNOSIS — M9903 Segmental and somatic dysfunction of lumbar region: Secondary | ICD-10-CM | POA: Diagnosis not present

## 2021-08-19 DIAGNOSIS — M542 Cervicalgia: Secondary | ICD-10-CM | POA: Diagnosis not present

## 2021-08-19 DIAGNOSIS — M6080 Other myositis, unspecified site: Secondary | ICD-10-CM | POA: Diagnosis not present

## 2021-08-19 DIAGNOSIS — M9901 Segmental and somatic dysfunction of cervical region: Secondary | ICD-10-CM | POA: Diagnosis not present

## 2021-09-02 DIAGNOSIS — M9901 Segmental and somatic dysfunction of cervical region: Secondary | ICD-10-CM | POA: Diagnosis not present

## 2021-09-02 DIAGNOSIS — M6080 Other myositis, unspecified site: Secondary | ICD-10-CM | POA: Diagnosis not present

## 2021-09-02 DIAGNOSIS — M542 Cervicalgia: Secondary | ICD-10-CM | POA: Diagnosis not present

## 2021-09-02 DIAGNOSIS — M9903 Segmental and somatic dysfunction of lumbar region: Secondary | ICD-10-CM | POA: Diagnosis not present

## 2021-09-03 DIAGNOSIS — M6080 Other myositis, unspecified site: Secondary | ICD-10-CM | POA: Diagnosis not present

## 2021-09-03 DIAGNOSIS — M9903 Segmental and somatic dysfunction of lumbar region: Secondary | ICD-10-CM | POA: Diagnosis not present

## 2021-09-03 DIAGNOSIS — M542 Cervicalgia: Secondary | ICD-10-CM | POA: Diagnosis not present

## 2021-09-03 DIAGNOSIS — M9901 Segmental and somatic dysfunction of cervical region: Secondary | ICD-10-CM | POA: Diagnosis not present

## 2021-09-11 DIAGNOSIS — N61 Mastitis without abscess: Secondary | ICD-10-CM | POA: Diagnosis not present

## 2021-09-15 ENCOUNTER — Other Ambulatory Visit: Payer: Self-pay | Admitting: Family Medicine

## 2021-09-16 ENCOUNTER — Other Ambulatory Visit: Payer: Self-pay | Admitting: Family Medicine

## 2021-09-16 DIAGNOSIS — N6012 Diffuse cystic mastopathy of left breast: Secondary | ICD-10-CM

## 2021-09-17 DIAGNOSIS — K5100 Ulcerative (chronic) pancolitis without complications: Secondary | ICD-10-CM | POA: Diagnosis not present

## 2021-10-07 DIAGNOSIS — M542 Cervicalgia: Secondary | ICD-10-CM | POA: Diagnosis not present

## 2021-10-07 DIAGNOSIS — M9903 Segmental and somatic dysfunction of lumbar region: Secondary | ICD-10-CM | POA: Diagnosis not present

## 2021-10-07 DIAGNOSIS — M9901 Segmental and somatic dysfunction of cervical region: Secondary | ICD-10-CM | POA: Diagnosis not present

## 2021-10-07 DIAGNOSIS — M6080 Other myositis, unspecified site: Secondary | ICD-10-CM | POA: Diagnosis not present

## 2021-10-08 DIAGNOSIS — M6080 Other myositis, unspecified site: Secondary | ICD-10-CM | POA: Diagnosis not present

## 2021-10-08 DIAGNOSIS — M542 Cervicalgia: Secondary | ICD-10-CM | POA: Diagnosis not present

## 2021-10-08 DIAGNOSIS — M9901 Segmental and somatic dysfunction of cervical region: Secondary | ICD-10-CM | POA: Diagnosis not present

## 2021-10-08 DIAGNOSIS — M9903 Segmental and somatic dysfunction of lumbar region: Secondary | ICD-10-CM | POA: Diagnosis not present

## 2021-10-13 DIAGNOSIS — M9901 Segmental and somatic dysfunction of cervical region: Secondary | ICD-10-CM | POA: Diagnosis not present

## 2021-10-13 DIAGNOSIS — M542 Cervicalgia: Secondary | ICD-10-CM | POA: Diagnosis not present

## 2021-10-13 DIAGNOSIS — M6080 Other myositis, unspecified site: Secondary | ICD-10-CM | POA: Diagnosis not present

## 2021-10-13 DIAGNOSIS — M9903 Segmental and somatic dysfunction of lumbar region: Secondary | ICD-10-CM | POA: Diagnosis not present

## 2021-10-15 DIAGNOSIS — M9903 Segmental and somatic dysfunction of lumbar region: Secondary | ICD-10-CM | POA: Diagnosis not present

## 2021-10-15 DIAGNOSIS — M542 Cervicalgia: Secondary | ICD-10-CM | POA: Diagnosis not present

## 2021-10-15 DIAGNOSIS — M6080 Other myositis, unspecified site: Secondary | ICD-10-CM | POA: Diagnosis not present

## 2021-10-15 DIAGNOSIS — M9901 Segmental and somatic dysfunction of cervical region: Secondary | ICD-10-CM | POA: Diagnosis not present

## 2021-10-27 DIAGNOSIS — N611 Abscess of the breast and nipple: Secondary | ICD-10-CM | POA: Diagnosis not present

## 2021-11-05 ENCOUNTER — Other Ambulatory Visit: Payer: Self-pay | Admitting: General Surgery

## 2021-11-05 DIAGNOSIS — N611 Abscess of the breast and nipple: Secondary | ICD-10-CM

## 2021-11-07 DIAGNOSIS — Z01419 Encounter for gynecological examination (general) (routine) without abnormal findings: Secondary | ICD-10-CM | POA: Diagnosis not present

## 2021-11-07 DIAGNOSIS — Z124 Encounter for screening for malignant neoplasm of cervix: Secondary | ICD-10-CM | POA: Diagnosis not present

## 2021-11-10 DIAGNOSIS — M9903 Segmental and somatic dysfunction of lumbar region: Secondary | ICD-10-CM | POA: Diagnosis not present

## 2021-11-10 DIAGNOSIS — M542 Cervicalgia: Secondary | ICD-10-CM | POA: Diagnosis not present

## 2021-11-10 DIAGNOSIS — M9901 Segmental and somatic dysfunction of cervical region: Secondary | ICD-10-CM | POA: Diagnosis not present

## 2021-11-10 DIAGNOSIS — M6080 Other myositis, unspecified site: Secondary | ICD-10-CM | POA: Diagnosis not present

## 2021-11-11 DIAGNOSIS — M6080 Other myositis, unspecified site: Secondary | ICD-10-CM | POA: Diagnosis not present

## 2021-11-11 DIAGNOSIS — M9901 Segmental and somatic dysfunction of cervical region: Secondary | ICD-10-CM | POA: Diagnosis not present

## 2021-11-11 DIAGNOSIS — M542 Cervicalgia: Secondary | ICD-10-CM | POA: Diagnosis not present

## 2021-11-11 DIAGNOSIS — M9903 Segmental and somatic dysfunction of lumbar region: Secondary | ICD-10-CM | POA: Diagnosis not present

## 2021-11-12 DIAGNOSIS — M9901 Segmental and somatic dysfunction of cervical region: Secondary | ICD-10-CM | POA: Diagnosis not present

## 2021-11-12 DIAGNOSIS — M9903 Segmental and somatic dysfunction of lumbar region: Secondary | ICD-10-CM | POA: Diagnosis not present

## 2021-11-12 DIAGNOSIS — M6080 Other myositis, unspecified site: Secondary | ICD-10-CM | POA: Diagnosis not present

## 2021-11-12 DIAGNOSIS — M542 Cervicalgia: Secondary | ICD-10-CM | POA: Diagnosis not present

## 2021-11-17 DIAGNOSIS — E782 Mixed hyperlipidemia: Secondary | ICD-10-CM | POA: Diagnosis not present

## 2021-11-17 DIAGNOSIS — F419 Anxiety disorder, unspecified: Secondary | ICD-10-CM | POA: Diagnosis not present

## 2021-11-17 DIAGNOSIS — N182 Chronic kidney disease, stage 2 (mild): Secondary | ICD-10-CM | POA: Diagnosis not present

## 2021-11-17 DIAGNOSIS — N6012 Diffuse cystic mastopathy of left breast: Secondary | ICD-10-CM | POA: Diagnosis not present

## 2021-11-17 DIAGNOSIS — E1122 Type 2 diabetes mellitus with diabetic chronic kidney disease: Secondary | ICD-10-CM | POA: Diagnosis not present

## 2021-11-19 ENCOUNTER — Ambulatory Visit
Admission: RE | Admit: 2021-11-19 | Discharge: 2021-11-19 | Disposition: A | Discharge status: Home / Self Care | Payer: BC Managed Care – PPO | Source: Ambulatory Visit | Attending: General Surgery | Admitting: General Surgery

## 2021-11-19 DIAGNOSIS — N6122 Granulomatous mastitis, left breast: Secondary | ICD-10-CM | POA: Diagnosis not present

## 2021-11-19 DIAGNOSIS — M542 Cervicalgia: Secondary | ICD-10-CM | POA: Diagnosis not present

## 2021-11-19 DIAGNOSIS — R928 Other abnormal and inconclusive findings on diagnostic imaging of breast: Secondary | ICD-10-CM | POA: Diagnosis not present

## 2021-11-19 DIAGNOSIS — M6080 Other myositis, unspecified site: Secondary | ICD-10-CM | POA: Diagnosis not present

## 2021-11-19 DIAGNOSIS — M9901 Segmental and somatic dysfunction of cervical region: Secondary | ICD-10-CM | POA: Diagnosis not present

## 2021-11-19 DIAGNOSIS — N611 Abscess of the breast and nipple: Secondary | ICD-10-CM

## 2021-11-19 DIAGNOSIS — M9903 Segmental and somatic dysfunction of lumbar region: Secondary | ICD-10-CM | POA: Diagnosis not present

## 2021-11-24 DIAGNOSIS — E781 Pure hyperglyceridemia: Secondary | ICD-10-CM | POA: Diagnosis not present

## 2021-12-01 DIAGNOSIS — E1165 Type 2 diabetes mellitus with hyperglycemia: Secondary | ICD-10-CM | POA: Diagnosis not present

## 2021-12-01 DIAGNOSIS — E785 Hyperlipidemia, unspecified: Secondary | ICD-10-CM | POA: Diagnosis not present

## 2021-12-30 DIAGNOSIS — N611 Abscess of the breast and nipple: Secondary | ICD-10-CM | POA: Diagnosis not present

## 2021-12-30 DIAGNOSIS — N6012 Diffuse cystic mastopathy of left breast: Secondary | ICD-10-CM | POA: Diagnosis not present

## 2021-12-31 DIAGNOSIS — K625 Hemorrhage of anus and rectum: Secondary | ICD-10-CM | POA: Diagnosis not present

## 2021-12-31 DIAGNOSIS — R109 Unspecified abdominal pain: Secondary | ICD-10-CM | POA: Diagnosis not present

## 2021-12-31 DIAGNOSIS — K5190 Ulcerative colitis, unspecified, without complications: Secondary | ICD-10-CM | POA: Diagnosis not present

## 2022-01-01 DIAGNOSIS — K5190 Ulcerative colitis, unspecified, without complications: Secondary | ICD-10-CM | POA: Diagnosis not present

## 2022-01-01 DIAGNOSIS — R109 Unspecified abdominal pain: Secondary | ICD-10-CM | POA: Diagnosis not present

## 2022-01-01 DIAGNOSIS — K625 Hemorrhage of anus and rectum: Secondary | ICD-10-CM | POA: Diagnosis not present

## 2022-01-20 DIAGNOSIS — K5190 Ulcerative colitis, unspecified, without complications: Secondary | ICD-10-CM | POA: Diagnosis not present

## 2022-01-20 DIAGNOSIS — A0472 Enterocolitis due to Clostridium difficile, not specified as recurrent: Secondary | ICD-10-CM | POA: Diagnosis not present

## 2022-05-11 DIAGNOSIS — M9901 Segmental and somatic dysfunction of cervical region: Secondary | ICD-10-CM | POA: Diagnosis not present

## 2022-05-11 DIAGNOSIS — M9903 Segmental and somatic dysfunction of lumbar region: Secondary | ICD-10-CM | POA: Diagnosis not present

## 2022-05-11 DIAGNOSIS — M542 Cervicalgia: Secondary | ICD-10-CM | POA: Diagnosis not present

## 2022-05-11 DIAGNOSIS — M6080 Other myositis, unspecified site: Secondary | ICD-10-CM | POA: Diagnosis not present

## 2022-05-12 DIAGNOSIS — M9901 Segmental and somatic dysfunction of cervical region: Secondary | ICD-10-CM | POA: Diagnosis not present

## 2022-05-12 DIAGNOSIS — M542 Cervicalgia: Secondary | ICD-10-CM | POA: Diagnosis not present

## 2022-05-12 DIAGNOSIS — M6080 Other myositis, unspecified site: Secondary | ICD-10-CM | POA: Diagnosis not present

## 2022-05-12 DIAGNOSIS — M9903 Segmental and somatic dysfunction of lumbar region: Secondary | ICD-10-CM | POA: Diagnosis not present

## 2022-05-13 DIAGNOSIS — M9903 Segmental and somatic dysfunction of lumbar region: Secondary | ICD-10-CM | POA: Diagnosis not present

## 2022-05-13 DIAGNOSIS — M542 Cervicalgia: Secondary | ICD-10-CM | POA: Diagnosis not present

## 2022-05-13 DIAGNOSIS — M6080 Other myositis, unspecified site: Secondary | ICD-10-CM | POA: Diagnosis not present

## 2022-05-13 DIAGNOSIS — M9901 Segmental and somatic dysfunction of cervical region: Secondary | ICD-10-CM | POA: Diagnosis not present

## 2022-05-25 DIAGNOSIS — Z0000 Encounter for general adult medical examination without abnormal findings: Secondary | ICD-10-CM | POA: Diagnosis not present

## 2022-05-25 DIAGNOSIS — Z79899 Other long term (current) drug therapy: Secondary | ICD-10-CM | POA: Diagnosis not present

## 2022-05-25 DIAGNOSIS — E1122 Type 2 diabetes mellitus with diabetic chronic kidney disease: Secondary | ICD-10-CM | POA: Diagnosis not present

## 2022-05-25 DIAGNOSIS — E782 Mixed hyperlipidemia: Secondary | ICD-10-CM | POA: Diagnosis not present

## 2022-05-25 DIAGNOSIS — E559 Vitamin D deficiency, unspecified: Secondary | ICD-10-CM | POA: Diagnosis not present

## 2022-06-24 DIAGNOSIS — M6080 Other myositis, unspecified site: Secondary | ICD-10-CM | POA: Diagnosis not present

## 2022-06-24 DIAGNOSIS — M9903 Segmental and somatic dysfunction of lumbar region: Secondary | ICD-10-CM | POA: Diagnosis not present

## 2022-06-24 DIAGNOSIS — M542 Cervicalgia: Secondary | ICD-10-CM | POA: Diagnosis not present

## 2022-06-24 DIAGNOSIS — M9901 Segmental and somatic dysfunction of cervical region: Secondary | ICD-10-CM | POA: Diagnosis not present

## 2022-06-25 DIAGNOSIS — M9901 Segmental and somatic dysfunction of cervical region: Secondary | ICD-10-CM | POA: Diagnosis not present

## 2022-06-25 DIAGNOSIS — M6080 Other myositis, unspecified site: Secondary | ICD-10-CM | POA: Diagnosis not present

## 2022-06-25 DIAGNOSIS — M542 Cervicalgia: Secondary | ICD-10-CM | POA: Diagnosis not present

## 2022-06-25 DIAGNOSIS — M9903 Segmental and somatic dysfunction of lumbar region: Secondary | ICD-10-CM | POA: Diagnosis not present

## 2022-09-22 DIAGNOSIS — M542 Cervicalgia: Secondary | ICD-10-CM | POA: Diagnosis not present

## 2022-09-22 DIAGNOSIS — M9901 Segmental and somatic dysfunction of cervical region: Secondary | ICD-10-CM | POA: Diagnosis not present

## 2022-09-22 DIAGNOSIS — M6080 Other myositis, unspecified site: Secondary | ICD-10-CM | POA: Diagnosis not present

## 2022-09-22 DIAGNOSIS — M9903 Segmental and somatic dysfunction of lumbar region: Secondary | ICD-10-CM | POA: Diagnosis not present

## 2022-09-23 DIAGNOSIS — M9903 Segmental and somatic dysfunction of lumbar region: Secondary | ICD-10-CM | POA: Diagnosis not present

## 2022-09-23 DIAGNOSIS — M9901 Segmental and somatic dysfunction of cervical region: Secondary | ICD-10-CM | POA: Diagnosis not present

## 2022-09-23 DIAGNOSIS — M542 Cervicalgia: Secondary | ICD-10-CM | POA: Diagnosis not present

## 2022-09-23 DIAGNOSIS — M6080 Other myositis, unspecified site: Secondary | ICD-10-CM | POA: Diagnosis not present

## 2022-09-28 DIAGNOSIS — M542 Cervicalgia: Secondary | ICD-10-CM | POA: Diagnosis not present

## 2022-09-28 DIAGNOSIS — M9903 Segmental and somatic dysfunction of lumbar region: Secondary | ICD-10-CM | POA: Diagnosis not present

## 2022-09-28 DIAGNOSIS — M6080 Other myositis, unspecified site: Secondary | ICD-10-CM | POA: Diagnosis not present

## 2022-09-28 DIAGNOSIS — M9901 Segmental and somatic dysfunction of cervical region: Secondary | ICD-10-CM | POA: Diagnosis not present

## 2022-09-30 DIAGNOSIS — M542 Cervicalgia: Secondary | ICD-10-CM | POA: Diagnosis not present

## 2022-09-30 DIAGNOSIS — M9903 Segmental and somatic dysfunction of lumbar region: Secondary | ICD-10-CM | POA: Diagnosis not present

## 2022-09-30 DIAGNOSIS — M6080 Other myositis, unspecified site: Secondary | ICD-10-CM | POA: Diagnosis not present

## 2022-09-30 DIAGNOSIS — M9901 Segmental and somatic dysfunction of cervical region: Secondary | ICD-10-CM | POA: Diagnosis not present

## 2022-10-21 DIAGNOSIS — M9903 Segmental and somatic dysfunction of lumbar region: Secondary | ICD-10-CM | POA: Diagnosis not present

## 2022-10-21 DIAGNOSIS — M9901 Segmental and somatic dysfunction of cervical region: Secondary | ICD-10-CM | POA: Diagnosis not present

## 2022-10-21 DIAGNOSIS — M542 Cervicalgia: Secondary | ICD-10-CM | POA: Diagnosis not present

## 2022-10-21 DIAGNOSIS — M6080 Other myositis, unspecified site: Secondary | ICD-10-CM | POA: Diagnosis not present

## 2022-11-25 DIAGNOSIS — M6080 Other myositis, unspecified site: Secondary | ICD-10-CM | POA: Diagnosis not present

## 2022-11-25 DIAGNOSIS — M9901 Segmental and somatic dysfunction of cervical region: Secondary | ICD-10-CM | POA: Diagnosis not present

## 2022-11-25 DIAGNOSIS — M542 Cervicalgia: Secondary | ICD-10-CM | POA: Diagnosis not present

## 2022-11-25 DIAGNOSIS — E1122 Type 2 diabetes mellitus with diabetic chronic kidney disease: Secondary | ICD-10-CM | POA: Diagnosis not present

## 2022-11-25 DIAGNOSIS — M9903 Segmental and somatic dysfunction of lumbar region: Secondary | ICD-10-CM | POA: Diagnosis not present

## 2022-11-25 DIAGNOSIS — G479 Sleep disorder, unspecified: Secondary | ICD-10-CM | POA: Diagnosis not present

## 2022-11-25 DIAGNOSIS — F419 Anxiety disorder, unspecified: Secondary | ICD-10-CM | POA: Diagnosis not present

## 2022-12-01 DIAGNOSIS — M9901 Segmental and somatic dysfunction of cervical region: Secondary | ICD-10-CM | POA: Diagnosis not present

## 2022-12-01 DIAGNOSIS — M6080 Other myositis, unspecified site: Secondary | ICD-10-CM | POA: Diagnosis not present

## 2022-12-01 DIAGNOSIS — M542 Cervicalgia: Secondary | ICD-10-CM | POA: Diagnosis not present

## 2022-12-01 DIAGNOSIS — M9903 Segmental and somatic dysfunction of lumbar region: Secondary | ICD-10-CM | POA: Diagnosis not present

## 2022-12-02 DIAGNOSIS — M9903 Segmental and somatic dysfunction of lumbar region: Secondary | ICD-10-CM | POA: Diagnosis not present

## 2022-12-02 DIAGNOSIS — M9901 Segmental and somatic dysfunction of cervical region: Secondary | ICD-10-CM | POA: Diagnosis not present

## 2022-12-02 DIAGNOSIS — M6080 Other myositis, unspecified site: Secondary | ICD-10-CM | POA: Diagnosis not present

## 2022-12-02 DIAGNOSIS — M542 Cervicalgia: Secondary | ICD-10-CM | POA: Diagnosis not present

## 2022-12-09 DIAGNOSIS — M9901 Segmental and somatic dysfunction of cervical region: Secondary | ICD-10-CM | POA: Diagnosis not present

## 2022-12-09 DIAGNOSIS — M9903 Segmental and somatic dysfunction of lumbar region: Secondary | ICD-10-CM | POA: Diagnosis not present

## 2022-12-09 DIAGNOSIS — M6080 Other myositis, unspecified site: Secondary | ICD-10-CM | POA: Diagnosis not present

## 2022-12-09 DIAGNOSIS — M542 Cervicalgia: Secondary | ICD-10-CM | POA: Diagnosis not present

## 2022-12-16 DIAGNOSIS — M542 Cervicalgia: Secondary | ICD-10-CM | POA: Diagnosis not present

## 2022-12-16 DIAGNOSIS — M9901 Segmental and somatic dysfunction of cervical region: Secondary | ICD-10-CM | POA: Diagnosis not present

## 2022-12-16 DIAGNOSIS — M9903 Segmental and somatic dysfunction of lumbar region: Secondary | ICD-10-CM | POA: Diagnosis not present

## 2022-12-16 DIAGNOSIS — M6080 Other myositis, unspecified site: Secondary | ICD-10-CM | POA: Diagnosis not present

## 2023-01-06 DIAGNOSIS — M9903 Segmental and somatic dysfunction of lumbar region: Secondary | ICD-10-CM | POA: Diagnosis not present

## 2023-01-06 DIAGNOSIS — M542 Cervicalgia: Secondary | ICD-10-CM | POA: Diagnosis not present

## 2023-01-06 DIAGNOSIS — M9901 Segmental and somatic dysfunction of cervical region: Secondary | ICD-10-CM | POA: Diagnosis not present

## 2023-01-06 DIAGNOSIS — M6080 Other myositis, unspecified site: Secondary | ICD-10-CM | POA: Diagnosis not present

## 2023-03-03 DIAGNOSIS — M9901 Segmental and somatic dysfunction of cervical region: Secondary | ICD-10-CM | POA: Diagnosis not present

## 2023-03-03 DIAGNOSIS — M542 Cervicalgia: Secondary | ICD-10-CM | POA: Diagnosis not present

## 2023-03-03 DIAGNOSIS — M6080 Other myositis, unspecified site: Secondary | ICD-10-CM | POA: Diagnosis not present

## 2023-03-03 DIAGNOSIS — M9903 Segmental and somatic dysfunction of lumbar region: Secondary | ICD-10-CM | POA: Diagnosis not present

## 2023-03-23 DIAGNOSIS — F17200 Nicotine dependence, unspecified, uncomplicated: Secondary | ICD-10-CM | POA: Diagnosis not present

## 2023-03-23 DIAGNOSIS — Z8719 Personal history of other diseases of the digestive system: Secondary | ICD-10-CM | POA: Diagnosis not present

## 2023-05-31 DIAGNOSIS — Z79899 Other long term (current) drug therapy: Secondary | ICD-10-CM | POA: Diagnosis not present

## 2023-05-31 DIAGNOSIS — E559 Vitamin D deficiency, unspecified: Secondary | ICD-10-CM | POA: Diagnosis not present

## 2023-05-31 DIAGNOSIS — E782 Mixed hyperlipidemia: Secondary | ICD-10-CM | POA: Diagnosis not present

## 2023-05-31 DIAGNOSIS — Z0000 Encounter for general adult medical examination without abnormal findings: Secondary | ICD-10-CM | POA: Diagnosis not present

## 2023-05-31 DIAGNOSIS — E1122 Type 2 diabetes mellitus with diabetic chronic kidney disease: Secondary | ICD-10-CM | POA: Diagnosis not present

## 2023-06-07 DIAGNOSIS — M6080 Other myositis, unspecified site: Secondary | ICD-10-CM | POA: Diagnosis not present

## 2023-06-07 DIAGNOSIS — M9901 Segmental and somatic dysfunction of cervical region: Secondary | ICD-10-CM | POA: Diagnosis not present

## 2023-06-07 DIAGNOSIS — M542 Cervicalgia: Secondary | ICD-10-CM | POA: Diagnosis not present

## 2023-06-07 DIAGNOSIS — M9903 Segmental and somatic dysfunction of lumbar region: Secondary | ICD-10-CM | POA: Diagnosis not present

## 2023-06-09 DIAGNOSIS — M6080 Other myositis, unspecified site: Secondary | ICD-10-CM | POA: Diagnosis not present

## 2023-06-09 DIAGNOSIS — M9901 Segmental and somatic dysfunction of cervical region: Secondary | ICD-10-CM | POA: Diagnosis not present

## 2023-06-09 DIAGNOSIS — M9903 Segmental and somatic dysfunction of lumbar region: Secondary | ICD-10-CM | POA: Diagnosis not present

## 2023-06-09 DIAGNOSIS — M542 Cervicalgia: Secondary | ICD-10-CM | POA: Diagnosis not present

## 2023-06-14 DIAGNOSIS — M6080 Other myositis, unspecified site: Secondary | ICD-10-CM | POA: Diagnosis not present

## 2023-06-14 DIAGNOSIS — M9903 Segmental and somatic dysfunction of lumbar region: Secondary | ICD-10-CM | POA: Diagnosis not present

## 2023-06-14 DIAGNOSIS — M542 Cervicalgia: Secondary | ICD-10-CM | POA: Diagnosis not present

## 2023-06-14 DIAGNOSIS — M9901 Segmental and somatic dysfunction of cervical region: Secondary | ICD-10-CM | POA: Diagnosis not present

## 2023-08-17 DIAGNOSIS — M9904 Segmental and somatic dysfunction of sacral region: Secondary | ICD-10-CM | POA: Diagnosis not present

## 2023-08-17 DIAGNOSIS — M5440 Lumbago with sciatica, unspecified side: Secondary | ICD-10-CM | POA: Diagnosis not present

## 2023-08-17 DIAGNOSIS — M9901 Segmental and somatic dysfunction of cervical region: Secondary | ICD-10-CM | POA: Diagnosis not present

## 2023-08-17 DIAGNOSIS — G44201 Tension-type headache, unspecified, intractable: Secondary | ICD-10-CM | POA: Diagnosis not present

## 2023-08-19 DIAGNOSIS — M9904 Segmental and somatic dysfunction of sacral region: Secondary | ICD-10-CM | POA: Diagnosis not present

## 2023-08-19 DIAGNOSIS — M5440 Lumbago with sciatica, unspecified side: Secondary | ICD-10-CM | POA: Diagnosis not present

## 2023-08-19 DIAGNOSIS — G44201 Tension-type headache, unspecified, intractable: Secondary | ICD-10-CM | POA: Diagnosis not present

## 2023-08-19 DIAGNOSIS — M9901 Segmental and somatic dysfunction of cervical region: Secondary | ICD-10-CM | POA: Diagnosis not present

## 2023-08-25 DIAGNOSIS — G44201 Tension-type headache, unspecified, intractable: Secondary | ICD-10-CM | POA: Diagnosis not present

## 2023-08-25 DIAGNOSIS — M9901 Segmental and somatic dysfunction of cervical region: Secondary | ICD-10-CM | POA: Diagnosis not present

## 2023-08-25 DIAGNOSIS — M9900 Segmental and somatic dysfunction of head region: Secondary | ICD-10-CM | POA: Diagnosis not present

## 2023-08-25 DIAGNOSIS — M542 Cervicalgia: Secondary | ICD-10-CM | POA: Diagnosis not present

## 2023-09-01 DIAGNOSIS — G44201 Tension-type headache, unspecified, intractable: Secondary | ICD-10-CM | POA: Diagnosis not present

## 2023-09-01 DIAGNOSIS — M9900 Segmental and somatic dysfunction of head region: Secondary | ICD-10-CM | POA: Diagnosis not present

## 2023-09-01 DIAGNOSIS — M542 Cervicalgia: Secondary | ICD-10-CM | POA: Diagnosis not present

## 2023-09-01 DIAGNOSIS — M9901 Segmental and somatic dysfunction of cervical region: Secondary | ICD-10-CM | POA: Diagnosis not present

## 2023-09-09 DIAGNOSIS — G44201 Tension-type headache, unspecified, intractable: Secondary | ICD-10-CM | POA: Diagnosis not present

## 2023-09-09 DIAGNOSIS — M542 Cervicalgia: Secondary | ICD-10-CM | POA: Diagnosis not present

## 2023-09-09 DIAGNOSIS — M9900 Segmental and somatic dysfunction of head region: Secondary | ICD-10-CM | POA: Diagnosis not present

## 2023-09-09 DIAGNOSIS — M9901 Segmental and somatic dysfunction of cervical region: Secondary | ICD-10-CM | POA: Diagnosis not present

## 2023-09-15 DIAGNOSIS — G44201 Tension-type headache, unspecified, intractable: Secondary | ICD-10-CM | POA: Diagnosis not present

## 2023-09-15 DIAGNOSIS — M542 Cervicalgia: Secondary | ICD-10-CM | POA: Diagnosis not present

## 2023-09-15 DIAGNOSIS — M9900 Segmental and somatic dysfunction of head region: Secondary | ICD-10-CM | POA: Diagnosis not present

## 2023-09-15 DIAGNOSIS — M9901 Segmental and somatic dysfunction of cervical region: Secondary | ICD-10-CM | POA: Diagnosis not present

## 2023-11-24 DIAGNOSIS — D72829 Elevated white blood cell count, unspecified: Secondary | ICD-10-CM | POA: Diagnosis not present

## 2023-11-24 DIAGNOSIS — E1122 Type 2 diabetes mellitus with diabetic chronic kidney disease: Secondary | ICD-10-CM | POA: Diagnosis not present

## 2023-12-06 DIAGNOSIS — G44201 Tension-type headache, unspecified, intractable: Secondary | ICD-10-CM | POA: Diagnosis not present

## 2023-12-06 DIAGNOSIS — M542 Cervicalgia: Secondary | ICD-10-CM | POA: Diagnosis not present

## 2023-12-06 DIAGNOSIS — M9901 Segmental and somatic dysfunction of cervical region: Secondary | ICD-10-CM | POA: Diagnosis not present

## 2023-12-06 DIAGNOSIS — M9900 Segmental and somatic dysfunction of head region: Secondary | ICD-10-CM | POA: Diagnosis not present

## 2023-12-09 DIAGNOSIS — G44201 Tension-type headache, unspecified, intractable: Secondary | ICD-10-CM | POA: Diagnosis not present

## 2023-12-09 DIAGNOSIS — M9900 Segmental and somatic dysfunction of head region: Secondary | ICD-10-CM | POA: Diagnosis not present

## 2023-12-09 DIAGNOSIS — M9901 Segmental and somatic dysfunction of cervical region: Secondary | ICD-10-CM | POA: Diagnosis not present

## 2023-12-09 DIAGNOSIS — M542 Cervicalgia: Secondary | ICD-10-CM | POA: Diagnosis not present

## 2023-12-13 DIAGNOSIS — M9900 Segmental and somatic dysfunction of head region: Secondary | ICD-10-CM | POA: Diagnosis not present

## 2023-12-13 DIAGNOSIS — G44201 Tension-type headache, unspecified, intractable: Secondary | ICD-10-CM | POA: Diagnosis not present

## 2023-12-13 DIAGNOSIS — M9901 Segmental and somatic dysfunction of cervical region: Secondary | ICD-10-CM | POA: Diagnosis not present

## 2023-12-13 DIAGNOSIS — M542 Cervicalgia: Secondary | ICD-10-CM | POA: Diagnosis not present

## 2023-12-20 DIAGNOSIS — M9901 Segmental and somatic dysfunction of cervical region: Secondary | ICD-10-CM | POA: Diagnosis not present

## 2023-12-20 DIAGNOSIS — M9900 Segmental and somatic dysfunction of head region: Secondary | ICD-10-CM | POA: Diagnosis not present

## 2023-12-20 DIAGNOSIS — M542 Cervicalgia: Secondary | ICD-10-CM | POA: Diagnosis not present

## 2023-12-20 DIAGNOSIS — G44201 Tension-type headache, unspecified, intractable: Secondary | ICD-10-CM | POA: Diagnosis not present

## 2023-12-30 DIAGNOSIS — M9901 Segmental and somatic dysfunction of cervical region: Secondary | ICD-10-CM | POA: Diagnosis not present

## 2023-12-30 DIAGNOSIS — M9900 Segmental and somatic dysfunction of head region: Secondary | ICD-10-CM | POA: Diagnosis not present

## 2023-12-30 DIAGNOSIS — G44201 Tension-type headache, unspecified, intractable: Secondary | ICD-10-CM | POA: Diagnosis not present

## 2023-12-30 DIAGNOSIS — M542 Cervicalgia: Secondary | ICD-10-CM | POA: Diagnosis not present

## 2024-01-06 DIAGNOSIS — M9900 Segmental and somatic dysfunction of head region: Secondary | ICD-10-CM | POA: Diagnosis not present

## 2024-01-06 DIAGNOSIS — M9901 Segmental and somatic dysfunction of cervical region: Secondary | ICD-10-CM | POA: Diagnosis not present

## 2024-01-06 DIAGNOSIS — M542 Cervicalgia: Secondary | ICD-10-CM | POA: Diagnosis not present

## 2024-01-06 DIAGNOSIS — G44201 Tension-type headache, unspecified, intractable: Secondary | ICD-10-CM | POA: Diagnosis not present

## 2024-01-13 DIAGNOSIS — M9901 Segmental and somatic dysfunction of cervical region: Secondary | ICD-10-CM | POA: Diagnosis not present

## 2024-01-13 DIAGNOSIS — G44201 Tension-type headache, unspecified, intractable: Secondary | ICD-10-CM | POA: Diagnosis not present

## 2024-01-13 DIAGNOSIS — M542 Cervicalgia: Secondary | ICD-10-CM | POA: Diagnosis not present

## 2024-01-13 DIAGNOSIS — M9900 Segmental and somatic dysfunction of head region: Secondary | ICD-10-CM | POA: Diagnosis not present

## 2024-01-17 DIAGNOSIS — M542 Cervicalgia: Secondary | ICD-10-CM | POA: Diagnosis not present

## 2024-01-17 DIAGNOSIS — M9900 Segmental and somatic dysfunction of head region: Secondary | ICD-10-CM | POA: Diagnosis not present

## 2024-01-17 DIAGNOSIS — G44201 Tension-type headache, unspecified, intractable: Secondary | ICD-10-CM | POA: Diagnosis not present

## 2024-01-17 DIAGNOSIS — M9901 Segmental and somatic dysfunction of cervical region: Secondary | ICD-10-CM | POA: Diagnosis not present

## 2024-01-24 DIAGNOSIS — G44201 Tension-type headache, unspecified, intractable: Secondary | ICD-10-CM | POA: Diagnosis not present

## 2024-01-24 DIAGNOSIS — M542 Cervicalgia: Secondary | ICD-10-CM | POA: Diagnosis not present

## 2024-01-24 DIAGNOSIS — M9901 Segmental and somatic dysfunction of cervical region: Secondary | ICD-10-CM | POA: Diagnosis not present

## 2024-02-03 DIAGNOSIS — G44201 Tension-type headache, unspecified, intractable: Secondary | ICD-10-CM | POA: Diagnosis not present

## 2024-02-03 DIAGNOSIS — M9901 Segmental and somatic dysfunction of cervical region: Secondary | ICD-10-CM | POA: Diagnosis not present

## 2024-02-03 DIAGNOSIS — M9900 Segmental and somatic dysfunction of head region: Secondary | ICD-10-CM | POA: Diagnosis not present

## 2024-02-03 DIAGNOSIS — M542 Cervicalgia: Secondary | ICD-10-CM | POA: Diagnosis not present

## 2024-02-10 DIAGNOSIS — M9900 Segmental and somatic dysfunction of head region: Secondary | ICD-10-CM | POA: Diagnosis not present

## 2024-02-10 DIAGNOSIS — M9901 Segmental and somatic dysfunction of cervical region: Secondary | ICD-10-CM | POA: Diagnosis not present

## 2024-02-10 DIAGNOSIS — M542 Cervicalgia: Secondary | ICD-10-CM | POA: Diagnosis not present

## 2024-02-10 DIAGNOSIS — G44201 Tension-type headache, unspecified, intractable: Secondary | ICD-10-CM | POA: Diagnosis not present

## 2024-02-15 DIAGNOSIS — L0292 Furuncle, unspecified: Secondary | ICD-10-CM | POA: Diagnosis not present

## 2024-02-15 DIAGNOSIS — G479 Sleep disorder, unspecified: Secondary | ICD-10-CM | POA: Diagnosis not present

## 2024-02-15 DIAGNOSIS — F419 Anxiety disorder, unspecified: Secondary | ICD-10-CM | POA: Diagnosis not present

## 2024-03-09 DIAGNOSIS — M9900 Segmental and somatic dysfunction of head region: Secondary | ICD-10-CM | POA: Diagnosis not present

## 2024-03-09 DIAGNOSIS — G44201 Tension-type headache, unspecified, intractable: Secondary | ICD-10-CM | POA: Diagnosis not present

## 2024-03-09 DIAGNOSIS — M9901 Segmental and somatic dysfunction of cervical region: Secondary | ICD-10-CM | POA: Diagnosis not present

## 2024-03-09 DIAGNOSIS — M542 Cervicalgia: Secondary | ICD-10-CM | POA: Diagnosis not present
